# Patient Record
Sex: Male | Born: 1965 | Race: Black or African American | Hispanic: No | Marital: Married | Smoking: Current some day smoker
Health system: Southern US, Community
[De-identification: ages and names within clinical notes are randomized; demographics above are authoritative.]

## PROBLEM LIST (undated history)

## (undated) DIAGNOSIS — T7840XA Allergy, unspecified, initial encounter: Secondary | ICD-10-CM

## (undated) DIAGNOSIS — E785 Hyperlipidemia, unspecified: Secondary | ICD-10-CM

## (undated) HISTORY — PX: NO PAST SURGERIES: SHX2092

## (undated) HISTORY — DX: Allergy, unspecified, initial encounter: T78.40XA

## (undated) HISTORY — DX: Hyperlipidemia, unspecified: E78.5

---

## 2012-08-05 ENCOUNTER — Other Ambulatory Visit (HOSPITAL_COMMUNITY): Payer: Self-pay | Admitting: Otolaryngology

## 2012-08-05 DIAGNOSIS — R1310 Dysphagia, unspecified: Secondary | ICD-10-CM

## 2012-08-12 ENCOUNTER — Ambulatory Visit (HOSPITAL_COMMUNITY)
Admission: RE | Admit: 2012-08-12 | Discharge: 2012-08-12 | Disposition: A | Discharge status: Home / Self Care | Payer: BC Managed Care – PPO | Source: Ambulatory Visit | Attending: Otolaryngology | Admitting: Otolaryngology

## 2012-08-12 ENCOUNTER — Other Ambulatory Visit (HOSPITAL_COMMUNITY): Payer: Self-pay

## 2012-08-12 DIAGNOSIS — R1310 Dysphagia, unspecified: Secondary | ICD-10-CM

## 2012-08-12 DIAGNOSIS — R1312 Dysphagia, oropharyngeal phase: Secondary | ICD-10-CM | POA: Insufficient documentation
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
## 2014-04-16 HISTORY — PX: COLONOSCOPY: SHX174

## 2016-06-08 DIAGNOSIS — Z0000 Encounter for general adult medical examination without abnormal findings: Secondary | ICD-10-CM | POA: Diagnosis not present

## 2016-06-08 DIAGNOSIS — Z125 Encounter for screening for malignant neoplasm of prostate: Secondary | ICD-10-CM | POA: Diagnosis not present

## 2016-06-08 DIAGNOSIS — Z114 Encounter for screening for human immunodeficiency virus [HIV]: Secondary | ICD-10-CM | POA: Diagnosis not present

## 2016-06-08 DIAGNOSIS — Z23 Encounter for immunization: Secondary | ICD-10-CM | POA: Diagnosis not present

## 2016-07-06 DIAGNOSIS — E782 Mixed hyperlipidemia: Secondary | ICD-10-CM | POA: Diagnosis not present

## 2017-02-27 DIAGNOSIS — E782 Mixed hyperlipidemia: Secondary | ICD-10-CM | POA: Diagnosis not present

## 2017-02-27 DIAGNOSIS — K760 Fatty (change of) liver, not elsewhere classified: Secondary | ICD-10-CM | POA: Diagnosis not present

## 2017-05-15 DIAGNOSIS — K5900 Constipation, unspecified: Secondary | ICD-10-CM | POA: Diagnosis not present

## 2017-08-10 ENCOUNTER — Emergency Department (HOSPITAL_BASED_OUTPATIENT_CLINIC_OR_DEPARTMENT_OTHER): Payer: BLUE CROSS/BLUE SHIELD

## 2017-08-10 ENCOUNTER — Emergency Department (HOSPITAL_BASED_OUTPATIENT_CLINIC_OR_DEPARTMENT_OTHER)
Admission: EM | Admit: 2017-08-10 | Discharge: 2017-08-10 | Disposition: A | Discharge status: Home / Self Care | Payer: BLUE CROSS/BLUE SHIELD | Attending: Emergency Medicine | Admitting: Emergency Medicine

## 2017-08-10 ENCOUNTER — Encounter (HOSPITAL_BASED_OUTPATIENT_CLINIC_OR_DEPARTMENT_OTHER): Payer: Self-pay | Admitting: Emergency Medicine

## 2017-08-10 DIAGNOSIS — Y9289 Other specified places as the place of occurrence of the external cause: Secondary | ICD-10-CM | POA: Diagnosis not present

## 2017-08-10 DIAGNOSIS — M542 Cervicalgia: Secondary | ICD-10-CM | POA: Diagnosis not present

## 2017-08-10 DIAGNOSIS — S199XXA Unspecified injury of neck, initial encounter: Secondary | ICD-10-CM | POA: Diagnosis not present

## 2017-08-10 DIAGNOSIS — Y999 Unspecified external cause status: Secondary | ICD-10-CM | POA: Diagnosis not present

## 2017-08-10 DIAGNOSIS — S161XXA Strain of muscle, fascia and tendon at neck level, initial encounter: Secondary | ICD-10-CM | POA: Diagnosis not present

## 2017-08-10 DIAGNOSIS — Y9389 Activity, other specified: Secondary | ICD-10-CM | POA: Diagnosis not present

## 2017-08-10 DIAGNOSIS — F17210 Nicotine dependence, cigarettes, uncomplicated: Secondary | ICD-10-CM | POA: Diagnosis not present

## 2017-08-10 DIAGNOSIS — S0990XA Unspecified injury of head, initial encounter: Secondary | ICD-10-CM | POA: Diagnosis not present

## 2017-08-10 DIAGNOSIS — R51 Headache: Secondary | ICD-10-CM | POA: Diagnosis not present

## 2017-08-10 MED ORDER — HYDROCODONE-ACETAMINOPHEN 5-325 MG PO TABS: 1.0000 | ORAL_TABLET | ORAL | 0 refills | Status: DC | PRN

## 2017-08-10 MED ORDER — CYCLOBENZAPRINE HCL 10 MG PO TABS: 10.0000 mg | ORAL_TABLET | Freq: Two times a day (BID) | ORAL | 0 refills | Status: DC | PRN

## 2017-08-10 MED ORDER — IBUPROFEN 600 MG PO TABS: 600.0000 mg | ORAL_TABLET | Freq: Four times a day (QID) | ORAL | 0 refills | Status: DC | PRN
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
## 2017-08-12 DIAGNOSIS — S161XXS Strain of muscle, fascia and tendon at neck level, sequela: Secondary | ICD-10-CM | POA: Diagnosis not present

## 2017-09-24 DIAGNOSIS — H2513 Age-related nuclear cataract, bilateral: Secondary | ICD-10-CM | POA: Diagnosis not present

## 2017-09-24 DIAGNOSIS — H35031 Hypertensive retinopathy, right eye: Secondary | ICD-10-CM | POA: Diagnosis not present

## 2017-09-24 DIAGNOSIS — H33321 Round hole, right eye: Secondary | ICD-10-CM | POA: Diagnosis not present

## 2017-11-07 DIAGNOSIS — S199XXA Unspecified injury of neck, initial encounter: Secondary | ICD-10-CM | POA: Diagnosis not present

## 2017-11-07 DIAGNOSIS — M542 Cervicalgia: Secondary | ICD-10-CM | POA: Diagnosis not present

## 2017-11-07 DIAGNOSIS — S161XXA Strain of muscle, fascia and tendon at neck level, initial encounter: Secondary | ICD-10-CM | POA: Diagnosis not present

## 2017-11-07 DIAGNOSIS — Y92410 Unspecified street and highway as the place of occurrence of the external cause: Secondary | ICD-10-CM | POA: Diagnosis not present

## 2017-11-07 DIAGNOSIS — Y999 Unspecified external cause status: Secondary | ICD-10-CM | POA: Diagnosis not present

## 2017-12-12 ENCOUNTER — Ambulatory Visit: Payer: BLUE CROSS/BLUE SHIELD | Admitting: Family Medicine

## 2017-12-12 ENCOUNTER — Encounter: Payer: Self-pay | Admitting: Family Medicine

## 2017-12-12 VITALS — BP 132/82 | HR 74 | Temp 98.2°F | Ht 67.5 in | Wt 214.5 lb

## 2017-12-12 DIAGNOSIS — E785 Hyperlipidemia, unspecified: Secondary | ICD-10-CM | POA: Diagnosis not present

## 2017-12-12 DIAGNOSIS — E782 Mixed hyperlipidemia: Secondary | ICD-10-CM | POA: Insufficient documentation

## 2017-12-12 DIAGNOSIS — E669 Obesity, unspecified: Secondary | ICD-10-CM | POA: Insufficient documentation

## 2017-12-12 DIAGNOSIS — Z23 Encounter for immunization: Secondary | ICD-10-CM

## 2017-12-12 DIAGNOSIS — Z720 Tobacco use: Secondary | ICD-10-CM | POA: Insufficient documentation

## 2017-12-12 NOTE — Addendum Note (Signed)
Addended by: Scharlene GlossEWING, Rashawn Rayman B on: 12/12/2017 10:10 AM   Modules accepted: Orders
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
## 2017-12-12 NOTE — Progress Notes (Signed)
Pre visit review using our clinic review tool, if applicable. No additional management support is needed unless otherwise documented below in the visit note. 

## 2017-12-12 NOTE — Patient Instructions (Signed)
Aim to do some physical exertion for 150 minutes per week. This is typically divided into 5 days per week, 30 minutes per day. The activity should be enough to get your heart rate up. Anything is better than nothing if you have time constraints.  Healthy Eating Plan Many factors influence your heart health, including eating and exercise habits. Heart (coronary) risk increases with abnormal blood fat (lipid) levels. Heart-healthy meal planning includes limiting unhealthy fats, increasing healthy fats, and making other small dietary changes. This includes maintaining a healthy body weight to help keep lipid levels within a normal range.  WHAT IS MY PLAN?  Your health care provider recommends that you:  Drink a glass of water before meals to help with satiety.  Eat slowly.  An alternative to the water is to add Metamucil. This will help with satiety as well. It does contain calories, unlike water.  WHAT TYPES OF FAT SHOULD I CHOOSE?  Choose healthy fats more often. Choose monounsaturated and polyunsaturated fats, such as olive oil and canola oil, flaxseeds, walnuts, almonds, and seeds.  Eat more omega-3 fats. Good choices include salmon, mackerel, sardines, tuna, flaxseed oil, and ground flaxseeds. Aim to eat fish at least two times each week.  Avoid foods with partially hydrogenated oils in them. These contain trans fats. Examples of foods that contain trans fats are stick margarine, some tub margarines, cookies, crackers, and other baked goods. If you are going to avoid a fat, this is the one to avoid!  WHAT GENERAL GUIDELINES DO I NEED TO FOLLOW?  Check food labels carefully to identify foods with trans fats. Avoid these types of options when possible.  Fill one half of your plate with vegetables and green salads. Eat 4-5 servings of vegetables per day. A serving of vegetables equals 1 cup of raw leafy vegetables,  cup of raw or cooked cut-up vegetables, or  cup of vegetable  juice.  Fill one fourth of your plate with whole grains. Look for the word "whole" as the first word in the ingredient list.  Fill one fourth of your plate with lean protein foods.  Eat 4-5 servings of fruit per day. A serving of fruit equals one medium whole fruit,  cup of dried fruit,  cup of fresh, frozen, or canned fruit. Try to avoid fruits in cups/syrups as the sugar content can be high.  Eat more foods that contain soluble fiber. Examples of foods that contain this type of fiber are apples, broccoli, carrots, beans, peas, and barley. Aim to get 20-30 g of fiber per day.  Eat more home-cooked food and less restaurant, buffet, and fast food.  Limit or avoid alcohol.  Limit foods that are high in starch and sugar.  Avoid fried foods when able.  Cook foods by using methods other than frying. Baking, boiling, grilling, and broiling are all great options. Other fat-reducing suggestions include: ? Removing the skin from poultry. ? Removing all visible fats from meats. ? Skimming the fat off of stews, soups, and gravies before serving them. ? Steaming vegetables in water or broth.  Lose weight if you are overweight. Losing just 5-10% of your initial body weight can help your overall health and prevent diseases such as diabetes and heart disease.  Increase your consumption of nuts, legumes, and seeds to 4-5 servings per week. One serving of dried beans or legumes equals  cup after being cooked, one serving of nuts equals 1 ounces, and one serving of seeds equals  ounce or   1 tablespoon.  WHAT ARE GOOD FOODS CAN I EAT? Grains Grainy breads (try to find bread that is 3 g of fiber per slice or greater), oatmeal, light popcorn. Whole-grain cereals. Rice and pasta, including brown rice and those that are made with whole wheat. Edamame pasta is a great alternative to grain pasta. It has a higher protein content. Try to avoid significant consumption of white bread, sugary cereals, or  pastries/baked goods.  Vegetables All vegetables. Cooked white potatoes do not count as vegetables.  Fruits All fruits, but limit pineapple and bananas as these fruits have a higher sugar content.  Meats and Other Protein Sources Lean, well-trimmed beef, veal, pork, and lamb. Chicken and turkey without skin. All fish and shellfish. Wild duck, rabbit, pheasant, and venison. Egg whites or low-cholesterol egg substitutes. Dried beans, peas, lentils, and tofu.Seeds and most nuts.  Dairy Low-fat or nonfat cheeses, including ricotta, string, and mozzarella. Skim or 1% milk that is liquid, powdered, or evaporated. Buttermilk that is made with low-fat milk. Nonfat or low-fat yogurt. Soy/Almond milk are good alternatives if you cannot handle dairy.  Beverages Water is the best for you. Sports drinks with less sugar are more desirable unless you are a highly active athlete.  Sweets and Desserts Sherbets and fruit ices. Honey, jam, marmalade, jelly, and syrups. Dark chocolate.  Eat all sweets and desserts in moderation.  Fats and Oils Nonhydrogenated (trans-free) margarines. Vegetable oils, including soybean, sesame, sunflower, olive, peanut, safflower, corn, canola, and cottonseed. Salad dressings or mayonnaise that are made with a vegetable oil. Limit added fats and oils that you use for cooking, baking, salads, and as spreads.  Other Cocoa powder. Coffee and tea. Most condiments.  The items listed above may not be a complete list of recommended foods or beverages. Contact your dietitian for more options.   
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
## 2018-05-05 ENCOUNTER — Encounter: Payer: Self-pay | Admitting: Family Medicine

## 2018-05-05 ENCOUNTER — Telehealth: Payer: Self-pay | Admitting: Gastroenterology

## 2018-05-05 ENCOUNTER — Ambulatory Visit (INDEPENDENT_AMBULATORY_CARE_PROVIDER_SITE_OTHER): Payer: BLUE CROSS/BLUE SHIELD | Admitting: Family Medicine

## 2018-05-05 VITALS — BP 108/78 | HR 58 | Temp 98.1°F | Ht 68.0 in | Wt 197.0 lb

## 2018-05-05 DIAGNOSIS — Z1211 Encounter for screening for malignant neoplasm of colon: Secondary | ICD-10-CM | POA: Diagnosis not present

## 2018-05-05 DIAGNOSIS — Z0000 Encounter for general adult medical examination without abnormal findings: Secondary | ICD-10-CM | POA: Insufficient documentation

## 2018-05-05 DIAGNOSIS — Z125 Encounter for screening for malignant neoplasm of prostate: Secondary | ICD-10-CM

## 2018-05-05 LAB — LIPID PANEL
Cholesterol: 171 mg/dL (ref 0–200)
HDL: 44 mg/dL (ref >39.00)
LDL Cholesterol: 91 mg/dL (ref 0–99)
NonHDL: 127.27
Total CHOL/HDL Ratio: 4
Triglycerides: 179 mg/dL — ABNORMAL HIGH (ref 0.0–149.0)
VLDL: 35.8 mg/dL (ref 0.0–40.0)

## 2018-05-05 LAB — COMPREHENSIVE METABOLIC PANEL
ALT: 25 U/L (ref 0–53)
AST: 18 U/L (ref 0–37)
Albumin: 4.3 g/dL (ref 3.5–5.2)
Alkaline Phosphatase: 50 U/L (ref 39–117)
BUN: 11 mg/dL (ref 6–23)
CO2: 29 mEq/L (ref 19–32)
Calcium: 9.5 mg/dL (ref 8.4–10.5)
Chloride: 107 mEq/L (ref 96–112)
Creatinine, Ser: 0.92 mg/dL (ref 0.40–1.50)
GFR: 112.01 mL/min (ref >60.00)
Glucose, Bld: 87 mg/dL (ref 70–99)
Potassium: 4.2 mEq/L (ref 3.5–5.1)
Sodium: 142 mEq/L (ref 135–145)
Total Bilirubin: 0.5 mg/dL (ref 0.2–1.2)
Total Protein: 6.6 g/dL (ref 6.0–8.3)

## 2018-05-05 LAB — PSA: PSA: 0.73 ng/mL (ref 0.10–4.00)

## 2018-05-05 NOTE — Progress Notes (Signed)
Chief Complaint  Patient presents with  . Annual Exam    Well Male Reginald Webb is here for a complete physical.   His last physical was >1 year ago.  Current diet: in general, a "healthy" diet.   Current exercise: elliptical, walk, lifting weights Weight trend: intentionally losing Does pt snore? Intermittently Daytime fatigue? No. Seat belt? Yes.    Health maintenance Tetanus- Yes  HIV- Yes - 05/05/18  Past Medical History:  Diagnosis Date  . Hyperlipidemia      Past Surgical History:  Procedure Laterality Date  . NO PAST SURGERIES      Medications  Takes no meds routinely.   Allergies No Known Allergies  Family History Family History  Problem Relation Age of Onset  . Cancer Neg Hx     Review of Systems: Constitutional: no fevers or chills Eye:  no recent significant change in vision Ear/Nose/Mouth/Throat:  Ears:  no tinnitus or hearing loss Nose/Mouth/Throat:  no complaints of nasal congestion, no sore throat Cardiovascular:  no chest pain, no palpitations Respiratory:  no cough and no shortness of breath Gastrointestinal:  no abdominal pain, no change in bowel habits GU:  Male: negative for dysuria, frequency, and incontinence and negative for prostate symptoms Musculoskeletal/Extremities:  no pain, redness, or swelling of the joints Integumentary (Skin/Breast):  no abnormal skin lesions reported Neurologic:  no headaches, no numbness, tingling Endocrine: No unexpected weight changes Hematologic/Lymphatic:  no night sweats  Exam BP 108/78 (BP Location: Left Arm, Patient Position: Sitting, Cuff Size: Large)   Pulse (!) 58   Temp 98.1 F (36.7 C) (Oral)   Ht  (1.727 m)   Wt 197 lb (89.4 kg)   SpO2 97%   BMI 29.95 kg/m  General:  well developed, well nourished, in no apparent distress Skin:  no significant moles, warts, or growths Head:  no masses, lesions, or tenderness Eyes:  pupils equal and round, sclera anicteric without  injection Ears:  canals without lesions, TMs shiny without retraction, no obvious effusion, no erythema Nose:  nares patent, septum midline, mucosa normal Throat/Pharynx:  lips and gingiva without lesion; tongue and uvula midline; non-inflamed pharynx; no exudates or postnasal drainage Neck: neck supple without adenopathy, thyromegaly, or masses Lungs:  clear to auscultation, breath sounds equal bilaterally, no respiratory distress Cardio:  regular rate and rhythm, no bruits, no LE edema Abdomen:  abdomen soft, nontender; bowel sounds normal; no masses or organomegaly Genital (male): circumcised penis, no lesions or discharge; testes present bilaterally without masses or tenderness Rectal: Deferred Musculoskeletal:  symmetrical muscle groups noted without atrophy or deformity Extremities:  no clubbing, cyanosis, or edema, no deformities, no skin discoloration Neuro:  gait normal; deep tendon reflexes normal and symmetric Psych: well oriented with normal range of affect and appropriate judgment/insight  Assessment and Plan  Well adult exam - Plan: Lipid panel, Comprehensive metabolic panel  Screen for colon cancer - Plan: Ambulatory referral to Gastroenterology  Screening for malignant neoplasm of prostate - Plan: PSA   Well 52 y.o. male. Counseled on diet and exercise. Counseled on risks and benefits of prostate cancer screening. He agrees to undergo testing. Other orders as above. Follow up in 1 year pending the above workup. The patient voiced understanding and agreement to the plan.  Jilda Roche Kelly, DO 05/05/18 10:38 AM

## 2018-05-05 NOTE — Progress Notes (Signed)
Pre visit review using our clinic review tool, if applicable. No additional management support is needed unless otherwise documented below in the visit note. 

## 2018-05-05 NOTE — Patient Instructions (Signed)
Keep up the good work.  Give [place] 2-3 business days to get the results of your labs back. If labs are normal, you will likely receive a letter in the mail unless you have MyChart. This can take longer than 2-3 business days.   If you do not hear anything about your referral in the next 1-2 weeks, call our office and ask for an update.  Let us know if you need anything.

## 2018-05-06 ENCOUNTER — Encounter: Payer: BLUE CROSS/BLUE SHIELD | Admitting: Family Medicine

## 2018-05-09 ENCOUNTER — Encounter: Payer: BLUE CROSS/BLUE SHIELD | Admitting: Family Medicine

## 2018-05-14 NOTE — Telephone Encounter (Signed)
Dr. Chales Abrahams reviewed records and has accepted patient. Ok to schedule Direct Colon. Patient states that he will callback to schedule.

## 2018-05-26 DIAGNOSIS — Z1211 Encounter for screening for malignant neoplasm of colon: Secondary | ICD-10-CM | POA: Diagnosis not present

## 2018-05-29 ENCOUNTER — Ambulatory Visit: Payer: BLUE CROSS/BLUE SHIELD | Admitting: Family Medicine

## 2018-05-29 ENCOUNTER — Encounter: Payer: Self-pay | Admitting: Family Medicine

## 2018-05-29 VITALS — BP 130/78 | HR 54 | Temp 97.0°F | Ht 68.0 in | Wt 206.4 lb

## 2018-05-29 DIAGNOSIS — B9789 Other viral agents as the cause of diseases classified elsewhere: Secondary | ICD-10-CM | POA: Diagnosis not present

## 2018-05-29 DIAGNOSIS — J069 Acute upper respiratory infection, unspecified: Secondary | ICD-10-CM | POA: Diagnosis not present

## 2018-05-29 MED ORDER — METHYLPREDNISOLONE ACETATE 80 MG/ML IJ SUSP
80.0000 mg | Freq: Once | INTRAMUSCULAR | Status: AC
  Administered 2018-05-29: 80 mg via INTRAMUSCULAR

## 2018-05-29 MED ORDER — BENZONATATE 100 MG PO CAPS: 100.0000 mg | ORAL_CAPSULE | Freq: Three times a day (TID) | ORAL | 0 refills | Status: DC | PRN

## 2018-05-29 MED FILL — BENZONATATE 100 MG CAPSULE: 100 | 10 days supply | Qty: 30 | Fill #0

## 2018-05-29 NOTE — Patient Instructions (Addendum)
Continue to push fluids, practice good hand hygiene, and cover your mouth if you cough.  If you start having fevers, shaking or shortness of breath, seek immediate care.  Claritin (loratadine), Allegra (fexofenadine), Zyrtec (cetirizine); these are listed in order from weakest to strongest. Generic, and therefore cheaper, options are in the parentheses.   There are available OTC, and the generic versions, which may be cheaper, are in parentheses. Show this to a pharmacist if you have trouble finding any of these items.

## 2018-05-29 NOTE — Progress Notes (Signed)
Chief Complaint  Patient presents with  . Cough    congestion    Reginald Webb here for URI complaints.  Duration: 3 days  Associated symptoms: sinus congestion, rhinorrhea, itchy watery eyes, sore throat, chest tightness, sneezing, and cough Denies: sinus pain, ear pain, ear drainage, wheezing, shortness of breath and fevers Treatment to date: cough drops, Alka Seltzer+, Mucinex Sick contacts: No  ROS:  Const: Denies fevers HEENT: As noted in HPI Lungs: No SOB  Past Medical History:  Diagnosis Date  . Hyperlipidemia    Family History  Problem Relation Age of Onset  . Cancer Neg Hx     BP 130/78 (BP Location: Left Arm, Patient Position: Sitting, Cuff Size: Large)   Pulse (!) 54   Temp (!) 97 F (36.1 C) (Oral)   Ht 5\' 8"  (1.727 m)   Wt 206 lb 6 oz (93.6 kg)   SpO2 98%   BMI 31.38 kg/m  General: Awake, alert, appears stated age HEENT: AT, [place], ears patent b/l and TM's neg, nares patent w/o discharge, pharynx pink and without exudates, MMM Neck: No masses or asymmetry Heart: RRR Lungs: CTAB, no accessory muscle use Psych: Age appropriate judgment and insight, normal mood and affect  Viral URI with cough - Plan: benzonatate (TESSALON) 100 MG capsule  Orders as above. PO antihistamine also rec'd.  Continue to push fluids, practice good hand hygiene, cover mouth when coughing. F/u prn. If starting to experience fevers, shaking, or shortness of breath, seek immediate care. Pt voiced understanding and agreement to the plan.  Jilda Rocheicholas Paul [place], DO 05/29/18 9:45 AM

## 2018-05-29 NOTE — Addendum Note (Signed)
Addended by: Scharlene GlossEWING, Ceili Boshers B on: 05/29/2018 09:50 AM   Modules accepted: Orders

## 2018-05-29 NOTE — Progress Notes (Signed)
Pre visit review using our clinic review tool, if applicable. No additional management support is needed unless otherwise documented below in the visit note. 

## 2018-07-10 NOTE — Telephone Encounter (Signed)
Patient has not returned phone call to schedule. Records will be in "records reviewed" folder. °

## 2018-10-28 DIAGNOSIS — H40013 Open angle with borderline findings, low risk, bilateral: Secondary | ICD-10-CM | POA: Diagnosis not present

## 2018-10-31 DIAGNOSIS — H2513 Age-related nuclear cataract, bilateral: Secondary | ICD-10-CM | POA: Diagnosis not present

## 2018-10-31 DIAGNOSIS — H33321 Round hole, right eye: Secondary | ICD-10-CM | POA: Diagnosis not present

## 2018-10-31 DIAGNOSIS — H35031 Hypertensive retinopathy, right eye: Secondary | ICD-10-CM | POA: Diagnosis not present

## 2018-12-02 DIAGNOSIS — H33321 Round hole, right eye: Secondary | ICD-10-CM | POA: Diagnosis not present

## 2019-02-10 ENCOUNTER — Ambulatory Visit: Payer: BLUE CROSS/BLUE SHIELD | Admitting: Internal Medicine

## 2019-02-10 ENCOUNTER — Encounter: Payer: Self-pay | Admitting: Internal Medicine

## 2019-02-10 VITALS — BP 126/80 | HR 61 | Temp 97.7°F | Resp 16 | Ht 68.0 in | Wt 204.1 lb

## 2019-02-10 DIAGNOSIS — M545 Low back pain, unspecified: Secondary | ICD-10-CM

## 2019-02-10 NOTE — Progress Notes (Signed)
Subjective:    Patient ID: Reginald Webb, male    DOB: [birth year], 53 y.o.   MRN: [id number]  DOS:  02/10/2019 Type of visit - description: Acute visit Yesterday he went to the gym as he usually does and did a lot of squatting and weightlifting. Last night, he was uncomfortable with right-sided low back pain.  No radiation Today, the pain got a little worse, he got aspirin containing OTC medication and it helped. Has not been able to go to work. He has occasional back pain but not as intense as this.   Review of Systems Denies fever chills No bladder or bowel incontinence No rash at the back No dysuria or gross hematuria  Past Medical History:  Diagnosis Date  . Hyperlipidemia     Past Surgical History:  Procedure Laterality Date  . NO PAST SURGERIES      Social History   Socioeconomic History  . Marital status: Married    Spouse name: Not on file  . Number of children: Not on file  . Years of education: Not on file  . Highest education level: Not on file  Occupational History  . Not on file  Social Needs  . Financial resource strain: Not on file  . Food insecurity:    Worry: Not on file    Inability: Not on file  . Transportation needs:    Medical: Not on file    Non-medical: Not on file  Tobacco Use  . Smoking status: Current Some Day Smoker    Types: Cigars  . Smokeless tobacco: Never Used  . Tobacco comment: Weekends- Black/Milds average of 3-4  Substance and Sexual Activity  . Alcohol use: Yes    Comment: rarely  . Drug use: No  . Sexual activity: Yes    Partners: Female  Lifestyle  . Physical activity:    Days per week: Not on file    Minutes per session: Not on file  . Stress: Not on file  Relationships  . Social connections:    Talks on phone: Not on file    Gets together: Not on file    Attends religious service: Not on file    Active member of club or organization: Not on file    Attends meetings of clubs or organizations: Not on file      Relationship status: Not on file  . Intimate partner violence:    Fear of current or ex partner: Not on file    Emotionally abused: Not on file    Physically abused: Not on file    Forced sexual activity: Not on file  Other Topics Concern  . Not on file  Social History Narrative  . Not on file      Allergies as of 02/10/2019   No Known Allergies     Medication List       Accurate as of February 10, 2019  2:43 PM. Always use your most recent med list.        benzonatate 100 MG capsule Commonly known as:  TESSALON Take 1 capsule (100 mg total) by mouth 3 (three) times daily as needed.           Objective:   Physical Exam BP 126/80 (BP Location: Left Arm, Patient Position: Sitting, Cuff Size: Normal)   Pulse 61   Temp 97.7 F (36.5 C) (Oral)   Resp 16   Ht 5\' 8"  (1.727 m)   Wt 204 lb 2 oz (92.6 kg)  SpO2 97%   BMI 31.04 kg/m  General:   Well developed, NAD, BMI noted.  HEENT:  Normocephalic . Face symmetric, atraumatic Abdomen:  Not distended, soft, non-tender. No rebound or rigidity. MSK: Slightly TTP at the right lower back around the SI joint.  No TTP at the lumbosacral spine. Skin: Not pale. Not jaundice Neurologic:  alert & oriented X3.  Speech normal, gait appropriate for age and unassisted. Motor and DTR symmetric.  Straight leg test negative. Very mild antalgic posture when he laid down on the table Psych--  Cognition and judgment appear intact.  Cooperative with normal attention span and concentration.  Behavior appropriate. No anxious or depressed appearing.     Assessment     53 year old male, PMH includes hyperlipidemia, presents with Acute lumbalgia: No red flag symptoms, probably a back sprain and/or  SI joint injury or irritation. Recommend conservative treatment with rest, cold, heat, continue with a OTC aspirin-containing medication.  GI precautions discussed. Start gentle stretching once he is better. Call if not gradually  improving.  See AVS. Work note provided.

## 2019-02-10 NOTE — Patient Instructions (Signed)
Rest, no heavy lifting for few days  Ice pack to the back  for the next 3 nights  Heating pad afterwards  Continue using the aspirin containing OTC medication, take it with food to prevent gastritis or stomach irritation.  Call if not gradually better in the next 7 to 10 days.

## 2019-02-10 NOTE — Progress Notes (Signed)
Pre visit review using our clinic review tool, if applicable. No additional management support is needed unless otherwise documented below in the visit note. 

## 2019-10-19 ENCOUNTER — Other Ambulatory Visit: Payer: Self-pay

## 2019-10-20 ENCOUNTER — Encounter: Payer: Self-pay | Admitting: Family Medicine

## 2019-10-20 ENCOUNTER — Ambulatory Visit (INDEPENDENT_AMBULATORY_CARE_PROVIDER_SITE_OTHER): Payer: BLUE CROSS/BLUE SHIELD | Admitting: Family Medicine

## 2019-10-20 VITALS — BP 108/62 | HR 64 | Temp 96.0°F | Ht 68.0 in | Wt 206.0 lb

## 2019-10-20 DIAGNOSIS — J302 Other seasonal allergic rhinitis: Secondary | ICD-10-CM

## 2019-10-20 DIAGNOSIS — Z1211 Encounter for screening for malignant neoplasm of colon: Secondary | ICD-10-CM | POA: Diagnosis not present

## 2019-10-20 DIAGNOSIS — Z125 Encounter for screening for malignant neoplasm of prostate: Secondary | ICD-10-CM

## 2019-10-20 DIAGNOSIS — Z0000 Encounter for general adult medical examination without abnormal findings: Secondary | ICD-10-CM | POA: Diagnosis not present

## 2019-10-20 LAB — CBC
HCT: 46 % (ref 39.0–52.0)
Hemoglobin: 15 g/dL (ref 13.0–17.0)
MCHC: 32.7 g/dL (ref 30.0–36.0)
MCV: 92.9 fl (ref 78.0–100.0)
Platelets: 236 10*3/uL (ref 150.0–400.0)
RBC: 4.95 Mil/uL (ref 4.22–5.81)
RDW: 13.9 % (ref 11.5–15.5)
WBC: 5.5 10*3/uL (ref 4.0–10.5)

## 2019-10-20 LAB — COMPREHENSIVE METABOLIC PANEL
ALT: 33 U/L (ref 0–53)
AST: 21 U/L (ref 0–37)
Albumin: 4.7 g/dL (ref 3.5–5.2)
Alkaline Phosphatase: 45 U/L (ref 39–117)
BUN: 15 mg/dL (ref 6–23)
CO2: 31 mEq/L (ref 19–32)
Calcium: 9.8 mg/dL (ref 8.4–10.5)
Chloride: 102 mEq/L (ref 96–112)
Creatinine, Ser: 0.93 mg/dL (ref 0.40–1.50)
GFR: 103.47 mL/min (ref >60.00)
Glucose, Bld: 98 mg/dL (ref 70–99)
Potassium: 4.7 mEq/L (ref 3.5–5.1)
Sodium: 139 mEq/L (ref 135–145)
Total Bilirubin: 0.5 mg/dL (ref 0.2–1.2)
Total Protein: 6.9 g/dL (ref 6.0–8.3)

## 2019-10-20 LAB — LIPID PANEL
Cholesterol: 200 mg/dL (ref 0–200)
HDL: 40.4 mg/dL (ref >39.00)
LDL Cholesterol: 130 mg/dL — ABNORMAL HIGH (ref 0–99)
NonHDL: 159.13
Total CHOL/HDL Ratio: 5
Triglycerides: 148 mg/dL (ref 0.0–149.0)
VLDL: 29.6 mg/dL (ref 0.0–40.0)

## 2019-10-20 MED ORDER — CETIRIZINE HCL 10 MG PO TABS: 10.0000 mg | ORAL_TABLET | Freq: Every day | ORAL | 5 refills | Status: DC

## 2019-10-20 NOTE — Progress Notes (Signed)
Chief Complaint  Patient presents with  . Annual Exam     allergies/a little cough after being outside    Well Male Reginald Webb is here for a complete physical.   His last physical was >1 year ago.  Current diet: in general, a "pretty good" diet.  [facility]; cardio and wt lifting Weight trend: stable Daytime fatigue? No. Seat belt? Yes.    Health mainten4ance Shingrix- No Colonoscopy- Yes Tetanus- Yes HIV- Yes   Past Medical History:  Diagnosis Date  . Hyperlipidemia       Past Surgical History:  Procedure Laterality Date  . NO PAST SURGERIES      Medications  Takes no meds routinely.   Allergies No Known Allergies  Family History Family History  Problem Relation Age of Onset  . Cancer Neg Hx     Review of Systems: Constitutional:  no fevers Eye:  no recent significant change in vision Ear/Nose/Mouth/Throat:  Ears:  no hearing loss Nose/Mouth/Throat:  no complaints of nasal congestion, no sore throat Cardiovascular:  no chest pain, no palpitations Respiratory:  no cough and no shortness of breath Gastrointestinal:  no abdominal pain, no change in bowel habits GU:  Male: negative for dysuria, frequency, and incontinence and negative for prostate symptoms Musculoskeletal/Extremities:  no pain, redness, or swelling of the joints Integumentary (Skin/Breast):  no abnormal skin lesions reported Neurologic:  no headaches Endocrine: No unexpected weight changes Hematologic/Lymphatic:  no abnormal bleeding  Exam BP 108/62 (BP Location: Left Arm, Patient Position: Sitting, Cuff Size: Large)   Pulse 64   Temp (!) 96 F (35.6 C) (Temporal)   Ht 5\' 8"  (1.727 m)   Wt 206 lb (93.4 kg)   SpO2 95%   BMI 31.32 kg/m  General:  well developed, well nourished, in no apparent distress Skin:  no significant moles, warts, or growths Head:  no masses, lesions, or tenderness Eyes:  pupils equal and round, sclera anicteric without  injection Ears:  canals without lesions, TMs shiny without retraction, no obvious effusion, no erythema Nose:  nares patent, septum midline, mucosa normal Throat/Pharynx:  lips and gingiva without lesion; tongue and uvula midline; non-inflamed pharynx; no exudates or postnasal drainage Neck: neck supple without adenopathy, thyromegaly, or masses Lungs:  clear to auscultation, breath sounds equal bilaterally, no respiratory distress Cardio:  regular rate and rhythm, no LE edema, no bruits Abdomen:  abdomen soft, nontender; bowel sounds normal; no masses or organomegaly Rectal: Deferred Musculoskeletal:  symmetrical muscle groups noted without atrophy or deformity Extremities:  no clubbing, cyanosis, or edema, no deformities, no skin discoloration Neuro:  gait normal; deep tendon reflexes normal and symmetric Psych: well oriented with normal range of affect and appropriate judgment/insight  Assessment and Plan  Well adult exam - Plan: Lipid panel, CBC, Comprehensive metabolic panel  Screen for colon cancer - Plan: Ambulatory referral to Gastroenterology  Screening for malignant neoplasm of prostate - Plan: PSA  Seasonal allergies   Well 53 y.o. male. Counseled on diet and exercise. Counseled on risks and benefits of prostate cancer screening with PSA. The patient agrees to undergo testing. Immunizations, labs, and further orders as above. Follow up in 1 yr or prn. The patient voiced understanding and agreement to the plan.  [place], DO 10/20/19 10:51 AM

## 2019-10-20 NOTE — Patient Instructions (Addendum)
Give [place] 2-3 business days to get the results of your labs back.   Keep the diet clean and stay active.  If you do not hear anything about your referral in the next 1-2 weeks, call our office and ask for an update.  The new Shingrix vaccine (for shingles) is a 2 shot series. It can make people feel low energy, achy and almost like they have the flu for 48 hours after injection. Please plan accordingly when deciding on when to get this shot. Call our office for a nurse visit appointment to get this. The second shot of the series is less severe regarding the side effects, but it still lasts 48 hours.   Claritin (loratadine), Allegra (fexofenadine), Zyrtec (cetirizine) which is also equivalent to Xyzal (levocetirizine); these are listed in order from weakest to strongest. Generic, and therefore cheaper, options are in the parentheses.   There are available OTC, and the generic versions, which may be cheaper, are in parentheses. Show this to a pharmacist if you have trouble finding any of these items.  Let us know if you need anything.

## 2019-10-22 LAB — PSA: PSA: 0.52 ng/mL (ref 0.10–4.00)

## 2019-10-23 ENCOUNTER — Other Ambulatory Visit: Payer: Self-pay | Admitting: Family Medicine

## 2019-10-23 DIAGNOSIS — E785 Hyperlipidemia, unspecified: Secondary | ICD-10-CM

## 2019-10-28 ENCOUNTER — Encounter: Payer: Self-pay | Admitting: Gastroenterology

## 2019-11-24 ENCOUNTER — Telehealth: Payer: Self-pay

## 2019-11-24 DIAGNOSIS — Z03818 Encounter for observation for suspected exposure to other biological agents ruled out: Secondary | ICD-10-CM | POA: Diagnosis not present

## 2019-11-24 DIAGNOSIS — U071 COVID-19: Secondary | ICD-10-CM | POA: Diagnosis not present

## 2019-11-24 NOTE — Telephone Encounter (Signed)
Patient No Showed for Pre-Visit. Patient was called to reschedule his appointment. Patient states that he was exposed to Covid 19 and was going today for a Covid screening. Patient states that he is experiencing a runny nose and a sore throat. Patient states that he wanted to to cancel his procedure until the first of the year. Patient states that he will call back and reschedule.   Riki Sheer, LPN    (PV)

## 2019-12-08 ENCOUNTER — Encounter: Payer: BLUE CROSS/BLUE SHIELD | Admitting: Gastroenterology

## 2019-12-30 ENCOUNTER — Encounter: Payer: Self-pay | Admitting: Gastroenterology

## 2020-02-05 ENCOUNTER — Ambulatory Visit (AMBULATORY_SURGERY_CENTER): Payer: BC Managed Care – PPO | Admitting: *Deleted

## 2020-02-05 ENCOUNTER — Other Ambulatory Visit: Payer: Self-pay

## 2020-02-05 VITALS — Ht 68.0 in | Wt 198.0 lb

## 2020-02-05 DIAGNOSIS — Z1211 Encounter for screening for malignant neoplasm of colon: Secondary | ICD-10-CM

## 2020-02-05 DIAGNOSIS — Z01818 Encounter for other preprocedural examination: Secondary | ICD-10-CM

## 2020-02-05 NOTE — Progress Notes (Signed)
Pt's previsit is done over the phone and all paperwork (prep instructions, blank consent form to just read over, pre-procedure acknowledgement form and stamped envelope) sent to patient  Pt is aware that care partner will wait in the car during procedure; if they feel like they will be too hot or cold to wait in the car; they may wait in the 4 th floor lobby. Patient is aware to bring only one care partner. We want them to wear a mask (we do not have any that we can provide them), practice social distancing, and we will check their temperatures when they get here.  I did remind the patient that their care partner needs to stay in the parking lot the entire time and have a cell phone available, we will call them when the pt is ready for discharge. Patient will wear mask into building.  No egg or soy allergy  No home oxygen use or problems with anesthesia  No medications for weight loss taken  emmi information given  No trouble moving neck  Pt had inadequate prep last time- 2 day Miralax/Miralax prep given  covid test 02-16-20 at 10:35 am

## 2020-02-16 ENCOUNTER — Other Ambulatory Visit: Payer: Self-pay | Admitting: Gastroenterology

## 2020-02-16 ENCOUNTER — Ambulatory Visit (INDEPENDENT_AMBULATORY_CARE_PROVIDER_SITE_OTHER): Payer: BC Managed Care – PPO

## 2020-02-16 DIAGNOSIS — Z1159 Encounter for screening for other viral diseases: Secondary | ICD-10-CM

## 2020-02-16 LAB — SARS CORONAVIRUS 2 (TAT 6-24 HRS): SARS Coronavirus 2: NEGATIVE

## 2020-02-17 ENCOUNTER — Encounter: Payer: Self-pay | Admitting: Gastroenterology

## 2020-02-19 ENCOUNTER — Other Ambulatory Visit: Payer: Self-pay

## 2020-02-19 ENCOUNTER — Ambulatory Visit (AMBULATORY_SURGERY_CENTER): Payer: BC Managed Care – PPO | Admitting: Gastroenterology

## 2020-02-19 ENCOUNTER — Encounter: Payer: Self-pay | Admitting: Gastroenterology

## 2020-02-19 VITALS — BP 127/78 | HR 57 | Temp 97.7°F | Resp 18 | Ht 68.0 in | Wt 198.0 lb

## 2020-02-19 DIAGNOSIS — Z1211 Encounter for screening for malignant neoplasm of colon: Secondary | ICD-10-CM | POA: Diagnosis not present

## 2020-02-19 DIAGNOSIS — Z86010 Personal history of colonic polyps: Secondary | ICD-10-CM

## 2020-02-19 MED ORDER — SODIUM CHLORIDE 0.9 % IV SOLN: 500.0000 mL | Freq: Once | INTRAVENOUS | Status: DC

## 2020-02-19 NOTE — Progress Notes (Signed)
To PACU, vss. Report to [email or web address] 

## 2020-02-19 NOTE — Patient Instructions (Signed)
Handouts on diverticulosis and hemorrhoids given to you today  Come to GI clinic as needed     YOU HAD AN ENDOSCOPIC PROCEDURE TODAY AT THE [facility]:   Refer to the procedure report that was given to you for any specific questions about what was found during the examination.  If the procedure report does not answer your questions, please call your gastroenterologist to clarify.  If you requested that your care partner not be given the details of your procedure findings, then the procedure report has been included in a sealed envelope for you to review at your convenience later.  YOU SHOULD EXPECT: Some feelings of bloating in the abdomen. Passage of more gas than usual.  Walking can help get rid of the air that was put into your GI tract during the procedure and reduce the bloating. If you had a lower endoscopy (such as a colonoscopy or flexible sigmoidoscopy) you may notice spotting of blood in your stool or on the toilet paper. If you underwent a bowel prep for your procedure, you may not have a normal bowel movement for a few days.  Please Note:  You might notice some irritation and congestion in your nose or some drainage.  This is from the oxygen used during your procedure.  There is no need for concern and it should clear up in a day or so.  SYMPTOMS TO REPORT IMMEDIATELY:   Following lower endoscopy (colonoscopy or flexible sigmoidoscopy):  Excessive amounts of blood in the stool  Significant tenderness or worsening of abdominal pains  Swelling of the abdomen that is new, acute  Fever of 100F or higher  For urgent or emergent issues, a gastroenterologist can be reached at any hour by calling [phone].   DIET:  We do recommend a small meal at first, but then you may proceed to your regular diet.  Drink plenty of fluids but you should avoid alcoholic beverages for 24 hours.  ACTIVITY:  You should plan to take it easy for the rest of today and you should NOT DRIVE  or use heavy machinery until tomorrow (because of the sedation medicines used during the test).    FOLLOW UP: Our staff will call the number listed on your records 48-72 hours following your procedure to check on you and address any questions or concerns that you may have regarding the information given to you following your procedure. If we do not reach you, we will leave a message.  We will attempt to reach you two times.  During this call, we will ask if you have developed any symptoms of COVID 19. If you develop any symptoms (ie: fever, flu-like symptoms, shortness of breath, cough etc.) before then, please call [phone].  If you test positive for Covid 19 in the 2 weeks post procedure, please call and report this information to [place].    If any biopsies were taken you will be contacted by phone or by letter within the next 1-3 weeks.  Please call us at [phone] if you have not heard about the biopsies in 3 weeks.    SIGNATURES/CONFIDENTIALITY: You and/or your care partner have signed paperwork which will be entered into your electronic medical record.  These signatures attest to the fact that that the information above on your After Visit Summary has been reviewed and is understood.  Full responsibility of the confidentiality of this discharge information lies with you and/or your care-partner.

## 2020-02-19 NOTE — Progress Notes (Signed)
Temp-JB VS-CW  Pt's states no medical or surgical changes since previsit or office visit.  

## 2020-02-19 NOTE — Op Note (Signed)
[place] Patient Name: Reginald Webb Procedure Date: 02/19/2020 9:01 AM MRN: [id number] Endoscopist: Jackquline Denmark , MD Age: 54 Referring MD:  Date of Birth: 12-09-1966 Gender: Male Account #: [id number] Procedure:                Colonoscopy Indications:              Screening for colorectal malignant neoplasm. Prev                            colon 03/2014- poor prep, hyperplastic polyps (Dr                            Virgel Bouquet) Medicines:                Monitored Anesthesia Care Procedure:                Pre-Anesthesia Assessment:                           - Prior to the procedure, a History and Physical                            was performed, and patient medications and                            allergies were reviewed. The patient's tolerance of                            previous anesthesia was also reviewed. The risks                            and benefits of the procedure and the sedation                            options and risks were discussed with the patient.                            All questions were answered, and informed consent                            was obtained. Prior Anticoagulants: The patient has                            taken no previous anticoagulant or antiplatelet                            agents. ASA Grade Assessment: I - A normal, healthy                            patient. After reviewing the risks and benefits,                            the patient was deemed in satisfactory condition to  undergo the procedure.                           After obtaining informed consent, the colonoscope                            was passed under direct vision. Throughout the                            procedure, the patient's blood pressure, pulse, and                            oxygen saturations were monitored continuously. The                            Colonoscope was introduced through the anus and                advanced to the Cecum. The colonoscopy was                            performed without difficulty. The patient tolerated                            the procedure well. The quality of the bowel                            preparation was good. The ileocecal valve,                            appendiceal orifice, and rectum were photographed. Scope In: 9:05:51 AM Scope Out: 9:15:28 AM Scope Withdrawal Time: 0 hours 6 minutes 54 seconds  Total Procedure Duration: 0 hours 9 minutes 37 seconds  Findings:                 A few small-mouthed diverticula were found in the                            sigmoid colon.                           Non-bleeding internal hemorrhoids were found during                            retroflexion. The hemorrhoids were small.                           The exam was otherwise without abnormality on                            direct and retroflexion views. Complications:            No immediate complications. Estimated Blood Loss:     Estimated blood loss: none. Impression:               -Mild sigmoid diverticulosis.                           -  Otherwise normal colonoscopy. Recommendation:           - Patient has a contact number available for                            emergencies. The signs and symptoms of potential                            delayed complications were discussed with the                            patient. Return to normal activities tomorrow.                            Written discharge instructions were provided to the                            patient.                           - Resume previous diet.                           - Continue present medications.                           - Repeat colonoscopy in 10 years for screening                            purposes. Earlier, if with any new problems or if                            there is any change in family history.                           - Return to GI clinic PRN. Lynann Bologna, MD 02/19/2020 9:21:05 AM This report has been signed electronically.

## 2020-02-23 ENCOUNTER — Telehealth: Payer: Self-pay

## 2020-02-23 NOTE — Telephone Encounter (Signed)
  Follow up Call-  Call back number 02/19/2020  Post procedure Call Back phone  # [phone]  Permission to leave phone message Yes  Some recent data might be hidden     Patient questions:  Do you have a fever, pain , or abdominal swelling? No. Pain Score  0 *  Have you tolerated food without any problems? Yes.    Have you been able to return to your normal activities? Yes.    Do you have any questions about your discharge instructions: Diet   No. Medications  No. Follow up visit  No.  Do you have questions or concerns about your Care? No.  Actions: * If pain score is 4 or above: No action needed, pain <4. 1. Have you developed a fever since your procedure? no  2.   Have you had an respiratory symptoms (SOB or cough) since your procedure? no  3.   Have you tested positive for COVID 19 since your procedure no  4.   Have you had any family members/close contacts diagnosed with the COVID 19 since your procedure?  no   If yes to any of these questions please route to Laverna Peace, RN and Jennye Boroughs, Charity fundraiser.

## 2020-02-23 NOTE — Telephone Encounter (Signed)
  Follow up Call-  Call back number 02/19/2020  Post procedure Call Back phone  # [phone]  Permission to leave phone message Yes  Some recent data might be hidden     Left message

## 2020-04-20 ENCOUNTER — Other Ambulatory Visit (INDEPENDENT_AMBULATORY_CARE_PROVIDER_SITE_OTHER): Payer: BC Managed Care – PPO

## 2020-04-20 ENCOUNTER — Other Ambulatory Visit: Payer: Self-pay

## 2020-04-20 DIAGNOSIS — E785 Hyperlipidemia, unspecified: Secondary | ICD-10-CM

## 2020-04-20 LAB — LIPID PANEL
Cholesterol: 191 mg/dL (ref 0–200)
HDL: 41.6 mg/dL (ref >39.00)
LDL Cholesterol: 122 mg/dL — ABNORMAL HIGH (ref 0–99)
NonHDL: 148.91
Total CHOL/HDL Ratio: 5
Triglycerides: 133 mg/dL (ref 0.0–149.0)
VLDL: 26.6 mg/dL (ref 0.0–40.0)

## 2020-09-07 ENCOUNTER — Other Ambulatory Visit: Payer: Self-pay

## 2020-09-07 ENCOUNTER — Other Ambulatory Visit: Payer: BC Managed Care – PPO

## 2020-09-07 DIAGNOSIS — Z20822 Contact with and (suspected) exposure to covid-19: Secondary | ICD-10-CM

## 2020-09-09 LAB — NOVEL CORONAVIRUS, NAA: SARS-CoV-2, NAA: NOT DETECTED

## 2020-09-09 LAB — SARS-COV-2, NAA 2 DAY TAT

## 2020-10-24 ENCOUNTER — Encounter: Payer: BLUE CROSS/BLUE SHIELD | Admitting: Family Medicine

## 2020-11-01 ENCOUNTER — Ambulatory Visit (INDEPENDENT_AMBULATORY_CARE_PROVIDER_SITE_OTHER): Payer: BC Managed Care – PPO | Admitting: Family Medicine

## 2020-11-01 ENCOUNTER — Encounter: Payer: Self-pay | Admitting: Family Medicine

## 2020-11-01 ENCOUNTER — Other Ambulatory Visit: Payer: Self-pay

## 2020-11-01 ENCOUNTER — Other Ambulatory Visit: Payer: Self-pay | Admitting: Family Medicine

## 2020-11-01 VITALS — BP 108/64 | HR 57 | Temp 98.0°F | Ht 68.0 in | Wt 209.2 lb

## 2020-11-01 DIAGNOSIS — Z0000 Encounter for general adult medical examination without abnormal findings: Secondary | ICD-10-CM

## 2020-11-01 DIAGNOSIS — Z1159 Encounter for screening for other viral diseases: Secondary | ICD-10-CM | POA: Diagnosis not present

## 2020-11-01 DIAGNOSIS — Z125 Encounter for screening for malignant neoplasm of prostate: Secondary | ICD-10-CM

## 2020-11-01 DIAGNOSIS — E785 Hyperlipidemia, unspecified: Secondary | ICD-10-CM

## 2020-11-01 LAB — COMPREHENSIVE METABOLIC PANEL
ALT: 40 U/L (ref 0–53)
AST: 24 U/L (ref 0–37)
Albumin: 4.5 g/dL (ref 3.5–5.2)
Alkaline Phosphatase: 43 U/L (ref 39–117)
BUN: 15 mg/dL (ref 6–23)
CO2: 31 mEq/L (ref 19–32)
Calcium: 9.7 mg/dL (ref 8.4–10.5)
Chloride: 101 mEq/L (ref 96–112)
Creatinine, Ser: 1.01 mg/dL (ref 0.40–1.50)
GFR: 85.53 mL/min (ref >60.00)
Glucose, Bld: 100 mg/dL — ABNORMAL HIGH (ref 70–99)
Potassium: 4.4 mEq/L (ref 3.5–5.1)
Sodium: 138 mEq/L (ref 135–145)
Total Bilirubin: 0.6 mg/dL (ref 0.2–1.2)
Total Protein: 6.7 g/dL (ref 6.0–8.3)

## 2020-11-01 LAB — LIPID PANEL
Cholesterol: 214 mg/dL — ABNORMAL HIGH (ref 0–200)
HDL: 46 mg/dL (ref >39.00)
NonHDL: 168.12
Total CHOL/HDL Ratio: 5
Triglycerides: 242 mg/dL — ABNORMAL HIGH (ref 0.0–149.0)
VLDL: 48.4 mg/dL — ABNORMAL HIGH (ref 0.0–40.0)

## 2020-11-01 LAB — LDL CHOLESTEROL, DIRECT: Direct LDL: 145 mg/dL

## 2020-11-01 LAB — PSA: PSA: 0.44 ng/mL (ref 0.10–4.00)

## 2020-11-01 NOTE — Progress Notes (Signed)
Chief Complaint  Patient presents with  . Annual Exam    Well Male Reginald Webb is here for a complete physical.   His last physical was >1 year ago.  Current diet: in general, a "healthy" diet.  Current exercise: lifting, cardio Weight trend: stable Fatigue out of ordinary? No. Seat belt? Yes.    Health maintenance Shingrix- No Colonoscopy- Yes Tetanus- Yes HIV- Yes Hep C- No   Past Medical History:  Diagnosis Date  . Allergy   . Hyperlipidemia    no per pt      Past Surgical History:  Procedure Laterality Date  . COLONOSCOPY  04/16/2014  . NO PAST SURGERIES      Medications  Current Outpatient Medications on File Prior to Visit  Medication Sig Dispense Refill  . cetirizine (ZYRTEC ALLERGY) 10 MG tablet Take 1 tablet (10 mg total) by mouth daily. (Patient taking differently: Take 10 mg by mouth daily. Takes PRN) 30 tablet 5  . Multiple Vitamin (MULTIVITAMIN ADULT PO) Take by mouth daily.      Allergies No Known Allergies  Family History Family History  Problem Relation Age of Onset  . Cancer Neg Hx   . Colon cancer Neg Hx   . Esophageal cancer Neg Hx   . Rectal cancer Neg Hx   . Stomach cancer Neg Hx   . Colon polyps Neg Hx     Review of Systems: Constitutional:  no fevers Eye:  no recent significant change in vision Ear/Nose/Mouth/Throat:  Ears:  no hearing loss Nose/Mouth/Throat:  no complaints of nasal congestion, no sore throat Cardiovascular:  no chest pain Respiratory:  no shortness of breath Gastrointestinal:  no change in bowel habits GU:  Male: negative for dysuria, frequency Musculoskeletal/Extremities:  no joint pain Integumentary (Skin/Breast):  no abnormal skin lesions reported Neurologic:  no headaches Endocrine: No unexpected weight changes Hematologic/Lymphatic:  no abnormal bleeding  Exam BP 108/64 (BP Location: Left Arm, Patient Position: Sitting, Cuff Size: Normal)   Pulse (!) 57   Temp 98 F (36.7 C) (Oral)   Ht 5\' 8"   (1.727 m)   Wt 209 lb 4 oz (94.9 kg)   SpO2 98%   BMI 31.82 kg/m  General:  well developed, well nourished, in no apparent distress Skin:  no significant moles, warts, or growths Head:  no masses, lesions, or tenderness Eyes:  pupils equal and round, sclera anicteric without injection Ears:  canals without lesions, TMs shiny without retraction, no obvious effusion, no erythema Nose:  nares patent, septum midline, mucosa normal Throat/Pharynx:  lips and gingiva without lesion; tongue and uvula midline; non-inflamed pharynx; no exudates or postnasal drainage Neck: neck supple without adenopathy, thyromegaly, or masses Cardiac: RRR, no bruits, no LE edema Lungs:  clear to auscultation, breath sounds equal bilaterally, no respiratory distress Rectal: Deferred Musculoskeletal:  symmetrical muscle groups noted without atrophy or deformity Neuro:  gait normal; deep tendon reflexes normal and symmetric Psych: well oriented with normal range of affect and appropriate judgment/insight  Assessment and Plan  Well adult exam - Plan: Lipid panel, Comprehensive metabolic panel  Screening for malignant neoplasm of prostate - Plan: PSA  Encounter for hepatitis C screening test for low risk patient - Plan: Hepatitis C antibody   Well 54 y.o. male. Counseled on diet and exercise. Counseled on risks and benefits of prostate cancer screening with PSA. The patient agrees to undergo testing. Immunizations, labs, and further orders as above. Rec'd Shingrix, covid and flu vaccination.  Follow up in  a year for CPE or prn. The patient voiced understanding and agreement to the plan.  Jilda Roche [place], DO 11/01/20 11:02 AM

## 2020-11-01 NOTE — Patient Instructions (Signed)
Give [place] 2-3 business days to get the results of your labs back.   Keep the diet clean and stay active.  I do recommend you get the COVID-19 vaccination, either Pfizer or Moderna. Please don't hesitate to reach out if you have any questions.   The new Shingrix vaccine (for shingles) is a 2 shot series. It can make people feel low energy, achy and almost like they have the flu for 48 hours after injection. Please plan accordingly when deciding on when to get this shot. Call our office for a nurse visit appointment to get this. The second shot of the series is less severe regarding the side effects, but it still lasts 48 hours.   Let us know if you need anything.

## 2020-11-02 LAB — HEPATITIS C ANTIBODY
Hepatitis C Ab: NONREACTIVE
SIGNAL TO CUT-OFF: 0.01 (ref <1.00)

## 2020-11-25 DIAGNOSIS — R197 Diarrhea, unspecified: Secondary | ICD-10-CM | POA: Diagnosis not present

## 2020-11-25 DIAGNOSIS — Z20822 Contact with and (suspected) exposure to covid-19: Secondary | ICD-10-CM | POA: Diagnosis not present

## 2020-11-25 DIAGNOSIS — R112 Nausea with vomiting, unspecified: Secondary | ICD-10-CM | POA: Diagnosis not present

## 2020-12-13 ENCOUNTER — Other Ambulatory Visit: Payer: Self-pay | Admitting: Family Medicine

## 2020-12-13 ENCOUNTER — Other Ambulatory Visit: Payer: Self-pay

## 2020-12-13 ENCOUNTER — Other Ambulatory Visit (INDEPENDENT_AMBULATORY_CARE_PROVIDER_SITE_OTHER): Payer: BC Managed Care – PPO

## 2020-12-13 DIAGNOSIS — E785 Hyperlipidemia, unspecified: Secondary | ICD-10-CM | POA: Diagnosis not present

## 2020-12-13 LAB — LIPID PANEL
Cholesterol: 202 mg/dL — ABNORMAL HIGH (ref 0–200)
HDL: 46.3 mg/dL (ref >39.00)
NonHDL: 155.7
Total CHOL/HDL Ratio: 4
Triglycerides: 268 mg/dL — ABNORMAL HIGH (ref 0.0–149.0)
VLDL: 53.6 mg/dL — ABNORMAL HIGH (ref 0.0–40.0)

## 2020-12-13 LAB — LDL CHOLESTEROL, DIRECT: Direct LDL: 127 mg/dL

## 2020-12-13 MED ORDER — ROSUVASTATIN CALCIUM 20 MG PO TABS: 20.0000 mg | ORAL_TABLET | Freq: Every day | ORAL | 3 refills | Status: DC

## 2021-04-19 ENCOUNTER — Other Ambulatory Visit: Payer: Self-pay

## 2021-04-19 ENCOUNTER — Ambulatory Visit: Payer: BC Managed Care – PPO | Admitting: Family Medicine

## 2021-04-19 ENCOUNTER — Encounter: Payer: Self-pay | Admitting: Family Medicine

## 2021-04-19 VITALS — BP 120/78 | HR 58 | Temp 97.6°F | Ht 68.0 in | Wt 209.0 lb

## 2021-04-19 DIAGNOSIS — N529 Male erectile dysfunction, unspecified: Secondary | ICD-10-CM | POA: Diagnosis not present

## 2021-04-19 MED ORDER — SILDENAFIL CITRATE 100 MG PO TABS: 50.0000 mg | ORAL_TABLET | Freq: Every day | ORAL | 2 refills | Status: DC | PRN

## 2021-04-19 NOTE — Progress Notes (Signed)
Chief Complaint  Patient presents with  . discuss getting viagra    Subjective: Patient is a 55 y.o. male here for ED.  Patient has a history of difficulty attaining and maintaining an erection for the past several months.  His libido is unchanged.  A friend gave him a tablet of Viagra and that worked well.  He is requesting his own prescription.  He has never been on the medicine  Past Medical History:  Diagnosis Date  . Allergy   . Hyperlipidemia    no per pt    Objective: BP 120/78 (BP Location: Right Arm, Patient Position: Sitting, Cuff Size: Normal)   Pulse (!) 58   Temp 97.6 F (36.4 C) (Oral)   Ht 5\' 8"  (1.727 m)   Wt 209 lb (94.8 kg)   SpO2 98%   BMI 31.78 kg/m  General: Awake, appears stated age Lungs: No accessory muscle use Psych: Age appropriate judgment and insight, normal affect and mood  Assessment and Plan: Erectile dysfunction, unspecified erectile dysfunction type - Plan: sildenafil (VIAGRA) 100 MG tablet  Trial sildenafil 100 mg daily.  I sent this in electronically to his pharmacy.  If it is too expensive, I did give him a hardcopy to take to any pharmacy using goodRx. The patient voiced understanding and agreement to the plan.  [place], DO 04/19/21  10:41 AM

## 2021-04-19 NOTE — Patient Instructions (Addendum)
Use Good[facility] if it is too expensive at [facility]. Let me know if you have questions with that.   Let us know if you need anything.

## 2021-11-14 ENCOUNTER — Other Ambulatory Visit: Payer: Self-pay | Admitting: Family Medicine

## 2021-12-08 DIAGNOSIS — R030 Elevated blood-pressure reading, without diagnosis of hypertension: Secondary | ICD-10-CM | POA: Diagnosis not present

## 2021-12-08 DIAGNOSIS — H6121 Impacted cerumen, right ear: Secondary | ICD-10-CM | POA: Diagnosis not present

## 2021-12-08 DIAGNOSIS — E669 Obesity, unspecified: Secondary | ICD-10-CM | POA: Diagnosis not present

## 2021-12-08 DIAGNOSIS — Z6832 Body mass index (BMI) 32.0-32.9, adult: Secondary | ICD-10-CM | POA: Diagnosis not present

## 2022-03-05 ENCOUNTER — Ambulatory Visit (INDEPENDENT_AMBULATORY_CARE_PROVIDER_SITE_OTHER): Payer: BC Managed Care – PPO | Admitting: Family Medicine

## 2022-03-05 ENCOUNTER — Encounter: Payer: Self-pay | Admitting: Family Medicine

## 2022-03-05 VITALS — BP 108/78 | HR 55 | Temp 98.0°F | Ht 68.0 in | Wt 201.5 lb

## 2022-03-05 DIAGNOSIS — Z125 Encounter for screening for malignant neoplasm of prostate: Secondary | ICD-10-CM | POA: Diagnosis not present

## 2022-03-05 DIAGNOSIS — Z0000 Encounter for general adult medical examination without abnormal findings: Secondary | ICD-10-CM | POA: Diagnosis not present

## 2022-03-05 LAB — CBC
HCT: 45.2 % (ref 39.0–52.0)
Hemoglobin: 14.7 g/dL (ref 13.0–17.0)
MCHC: 32.5 g/dL (ref 30.0–36.0)
MCV: 92.2 fl (ref 78.0–100.0)
Platelets: 234 10*3/uL (ref 150.0–400.0)
RBC: 4.9 Mil/uL (ref 4.22–5.81)
RDW: 14.2 % (ref 11.5–15.5)
WBC: 6.1 10*3/uL (ref 4.0–10.5)

## 2022-03-05 LAB — COMPREHENSIVE METABOLIC PANEL
ALT: 29 U/L (ref 0–53)
AST: 19 U/L (ref 0–37)
Albumin: 4.7 g/dL (ref 3.5–5.2)
Alkaline Phosphatase: 44 U/L (ref 39–117)
BUN: 18 mg/dL (ref 6–23)
CO2: 29 mEq/L (ref 19–32)
Calcium: 10 mg/dL (ref 8.4–10.5)
Chloride: 105 mEq/L (ref 96–112)
Creatinine, Ser: 0.97 mg/dL (ref 0.40–1.50)
GFR: 88.94 mL/min (ref >60.00)
Glucose, Bld: 98 mg/dL (ref 70–99)
Potassium: 4.4 mEq/L (ref 3.5–5.1)
Sodium: 141 mEq/L (ref 135–145)
Total Bilirubin: 0.5 mg/dL (ref 0.2–1.2)
Total Protein: 6.8 g/dL (ref 6.0–8.3)

## 2022-03-05 LAB — LIPID PANEL
Cholesterol: 123 mg/dL (ref 0–200)
HDL: 50.9 mg/dL (ref >39.00)
LDL Cholesterol: 51 mg/dL (ref 0–99)
NonHDL: 71.96
Total CHOL/HDL Ratio: 2
Triglycerides: 103 mg/dL (ref 0.0–149.0)
VLDL: 20.6 mg/dL (ref 0.0–40.0)

## 2022-03-05 LAB — PSA: PSA: 0.4 ng/mL (ref 0.10–4.00)

## 2022-03-05 NOTE — Progress Notes (Signed)
Chief Complaint  ?Patient presents with  ? Annual Exam  ? ? ?Well Male ?Reginald Webb is here for a complete physical.   ?His last physical was >1 year ago.  ?Current diet: in general, a "healthy" diet.  ?Current exercise: walking, elliptical  ?Weight trend: down a few lbs  ?Fatigue out of ordinary? No. ?Seat belt? Yes.   ?Advanced directive? No ? ?Health maintenance ?Shingrix- No ?Colonoscopy- Yes ?Tetanus- Yes ?HIV- Yes ?Hep C- Yes ?  ?Past Medical History:  ?Diagnosis Date  ? Allergy   ? Hyperlipidemia   ? no per pt  ?  ? ? ?Past Surgical History:  ?Procedure Laterality Date  ? COLONOSCOPY  04/16/2014  ? NO PAST SURGERIES    ? ? ?Medications  ?Current Outpatient Medications on File Prior to Visit  ?Medication Sig Dispense Refill  ? cetirizine (ZYRTEC ALLERGY) 10 MG tablet Take 1 tablet (10 mg total) by mouth daily. (Patient taking differently: Take 10 mg by mouth daily. Takes PRN) 30 tablet 5  ? Multiple Vitamin (MULTIVITAMIN ADULT PO) Take by mouth daily.    ? rosuvastatin (CRESTOR) 20 MG tablet TAKE 1 TABLET BY MOUTH EVERY DAY 90 tablet 3  ? sildenafil (VIAGRA) 100 MG tablet Take 0.5-1 tablets (50-100 mg total) by mouth daily as needed for erectile dysfunction. 30 tablet 2  ? ?No current facility-administered medications on file prior to visit.  ?  ? ?Allergies ?No Known Allergies ? ?Family History ?Family History  ?Problem Relation Age of Onset  ? Cancer Neg Hx   ? Colon cancer Neg Hx   ? Esophageal cancer Neg Hx   ? Rectal cancer Neg Hx   ? Stomach cancer Neg Hx   ? Colon polyps Neg Hx   ? ? ?Review of Systems: ?Constitutional:  no fevers ?Eye:  no recent significant change in vision ?Ear/Nose/Mouth/Throat:  Ears:  no hearing loss ?Nose/Mouth/Throat:  no complaints of nasal congestion, no sore throat ?Cardiovascular:  no chest pain ?Respiratory:  no shortness of breath ?Gastrointestinal:  no change in bowel habits ?GU:  Male: negative for dysuria, frequency ?Musculoskeletal/Extremities:  no joint  pain ?Integumentary (Skin/Breast):  no abnormal skin lesions reported ?Neurologic:  no headaches ?Endocrine: No unexpected weight changes ?Hematologic/Lymphatic:  no abnormal bleeding ? ?Exam ?BP 108/78   Pulse (!) 55   Temp 98 ?F (36.7 ?C) (Oral)   Ht 5\' 8"  (1.727 m)   Wt 201 lb 8 oz (91.4 kg)   SpO2 99%   BMI 30.64 kg/m?  ?General:  well developed, well nourished, in no apparent distress ?Skin:  no significant moles, warts, or growths ?Head:  no masses, lesions, or tenderness ?Eyes:  pupils equal and round, sclera anicteric without injection ?Ears:  canals without lesions, TMs shiny without retraction, no obvious effusion, no erythema ?Nose:  nares patent, septum midline, mucosa normal ?Throat/Pharynx:  lips and gingiva without lesion; tongue and uvula midline; non-inflamed pharynx; no exudates or postnasal drainage ?Neck: neck supple without adenopathy, thyromegaly, or masses ?Cardiac: RRR, no bruits, no LE edema ?Lungs:  clear to auscultation, breath sounds equal bilaterally, no respiratory distress ?Abdomen: BS+, soft, non-tender, non-distended, no masses or organomegaly noted ?Rectal: Deferred ?Musculoskeletal:  symmetrical muscle groups noted without atrophy or deformity ?Neuro:  gait normal; deep tendon reflexes normal and symmetric ?Psych: well oriented with normal range of affect and appropriate judgment/insight ? ?Assessment and Plan ? ?Well adult exam - Plan: CBC, Comprehensive metabolic panel, Lipid panel ? ?Screening for malignant neoplasm of prostate - Plan:  PSA  ? ?Well 56 y.o. male. ?Counseled on diet and exercise. ?Counseled on risks and benefits of prostate cancer screening with PSA. The patient agrees to undergo testing. ?Advanced directive form provided today.  ?Shingrix rec'd.  ?Immunizations, labs, and further orders as above. ?Follow up in 6 mo. ?The patient voiced understanding and agreement to the plan. ? ?Shelda Pal, DO ?03/05/22 ?11:01 AM ? ?

## 2022-03-05 NOTE — Patient Instructions (Addendum)
Give us 2-3 business days to get the results of your labs back.  ? ?Keep the diet clean and stay active. ? ?Please get me a copy of your advanced directive form at your convenience.  ? ?The Shingrix vaccine (for shingles) is a 2 shot series. It can make people feel low energy, achy and almost like they have the flu for 48 hours after injection. Please plan accordingly when deciding on when to get this shot. Call our office for a nurse visit appointment to get this. The second shot of the series is less severe regarding the side effects, but it still lasts 48 hours.  ? ?Let us know if you need anything. ?

## 2022-03-06 ENCOUNTER — Encounter: Payer: BC Managed Care – PPO | Admitting: Family Medicine

## 2022-09-03 ENCOUNTER — Ambulatory Visit: Payer: BC Managed Care – PPO | Admitting: Family Medicine

## 2022-09-03 ENCOUNTER — Encounter: Payer: Self-pay | Admitting: Family Medicine

## 2022-09-03 VITALS — BP 132/80 | HR 64 | Temp 98.0°F | Ht 68.0 in | Wt 199.2 lb

## 2022-09-03 DIAGNOSIS — E782 Mixed hyperlipidemia: Secondary | ICD-10-CM | POA: Diagnosis not present

## 2022-09-03 DIAGNOSIS — N529 Male erectile dysfunction, unspecified: Secondary | ICD-10-CM | POA: Diagnosis not present

## 2022-09-03 MED ORDER — SILDENAFIL CITRATE 100 MG PO TABS: 50.0000 mg | ORAL_TABLET | Freq: Every day | ORAL | 2 refills | Status: DC | PRN

## 2022-09-03 NOTE — Progress Notes (Signed)
Chief Complaint  Patient presents with   Follow-up    Subjective: Hyperlipidemia Patient presents for Hyperlipidemia follow up. Currently taking Crestro 20 mg/d and compliance with treatment thus far has been good. He denies myalgias. He is adhering to a healthy diet. Exercise: elliptical machine, lifting wts, walking No Cp or SOB.  The patient is not known to have coexisting coronary artery disease.  ED Hx of ED on sildenafil 100 mg qd prn. Works well. No AE's. Affordable.   Past Medical History:  Diagnosis Date   Allergy    Hyperlipidemia     Objective: BP 132/80   Pulse 64   Temp 98 F (36.7 C) (Oral)   Ht 5\' 8"  (1.727 m)   Wt 199 lb 4 oz (90.4 kg)   SpO2 96%   BMI 30.30 kg/m  General: Awake, appears stated age Heart: RRR, no LE edema, no bruits Lungs: CTAB, no rales, wheezes or rhonchi. No accessory muscle use Psych: Age appropriate judgment and insight, normal affect and mood  Assessment and Plan: Mixed hyperlipidemia  Erectile dysfunction, unspecified erectile dysfunction type - Plan: sildenafil (VIAGRA) 100 MG tablet  Chronic, stable.  Counseled on diet/exercise. Cont Crestor 20 mg/d.  Chronic, stable.  Continue sildenafil 100 mg daily as needed. F/u in 6 mo or prn. The patient voiced understanding and agreement to the plan.  [place], DO 09/03/22  12:02 PM

## 2022-09-03 NOTE — Patient Instructions (Signed)
Keep the diet clean and stay active.  I recommend getting the flu shot in mid October. This suggestion would change if the CDC comes out with a different recommendation.   Let us know if you need anything.  

## 2022-11-16 ENCOUNTER — Other Ambulatory Visit: Payer: Self-pay | Admitting: Family Medicine

## 2023-10-21 ENCOUNTER — Ambulatory Visit (INDEPENDENT_AMBULATORY_CARE_PROVIDER_SITE_OTHER): Payer: BC Managed Care – PPO | Admitting: Family Medicine

## 2023-10-21 ENCOUNTER — Encounter: Payer: Self-pay | Admitting: Family Medicine

## 2023-10-21 VITALS — BP 130/78 | HR 63 | Temp 98.0°F | Resp 16 | Ht 68.0 in | Wt 203.2 lb

## 2023-10-21 DIAGNOSIS — Z125 Encounter for screening for malignant neoplasm of prostate: Secondary | ICD-10-CM | POA: Diagnosis not present

## 2023-10-21 DIAGNOSIS — N529 Male erectile dysfunction, unspecified: Secondary | ICD-10-CM

## 2023-10-21 DIAGNOSIS — Z13220 Encounter for screening for lipoid disorders: Secondary | ICD-10-CM

## 2023-10-21 DIAGNOSIS — Z0000 Encounter for general adult medical examination without abnormal findings: Secondary | ICD-10-CM | POA: Diagnosis not present

## 2023-10-21 LAB — LIPID PANEL
Cholesterol: 212 mg/dL — ABNORMAL HIGH (ref 0–200)
HDL: 53.6 mg/dL (ref >39.00)
LDL Cholesterol: 126 mg/dL — ABNORMAL HIGH (ref 0–99)
NonHDL: 158.75
Total CHOL/HDL Ratio: 4
Triglycerides: 163 mg/dL — ABNORMAL HIGH (ref 0.0–149.0)
VLDL: 32.6 mg/dL (ref 0.0–40.0)

## 2023-10-21 LAB — CBC
HCT: 45.9 % (ref 39.0–52.0)
Hemoglobin: 14.6 g/dL (ref 13.0–17.0)
MCHC: 31.9 g/dL (ref 30.0–36.0)
MCV: 95.9 fL (ref 78.0–100.0)
Platelets: 208 10*3/uL (ref 150.0–400.0)
RBC: 4.78 Mil/uL (ref 4.22–5.81)
RDW: 14.2 % (ref 11.5–15.5)
WBC: 6.9 10*3/uL (ref 4.0–10.5)

## 2023-10-21 LAB — COMPREHENSIVE METABOLIC PANEL
ALT: 38 U/L (ref 0–53)
AST: 27 U/L (ref 0–37)
Albumin: 4.6 g/dL (ref 3.5–5.2)
Alkaline Phosphatase: 47 U/L (ref 39–117)
BUN: 15 mg/dL (ref 6–23)
CO2: 29 meq/L (ref 19–32)
Calcium: 10.2 mg/dL (ref 8.4–10.5)
Chloride: 102 meq/L (ref 96–112)
Creatinine, Ser: 1.12 mg/dL (ref 0.40–1.50)
GFR: 73.99 mL/min (ref >60.00)
Glucose, Bld: 85 mg/dL (ref 70–99)
Potassium: 4.1 meq/L (ref 3.5–5.1)
Sodium: 139 meq/L (ref 135–145)
Total Bilirubin: 0.6 mg/dL (ref 0.2–1.2)
Total Protein: 6.9 g/dL (ref 6.0–8.3)

## 2023-10-21 LAB — PSA: PSA: 0.65 ng/mL (ref 0.10–4.00)

## 2023-10-21 MED ORDER — SILDENAFIL CITRATE 100 MG PO TABS: 50.0000 mg | ORAL_TABLET | Freq: Every day | ORAL | 2 refills | Status: AC | PRN

## 2023-10-21 NOTE — Progress Notes (Signed)
Chief Complaint  Patient presents with   Annual Exam    Annual Exam     Well Male Reginald Webb is here for a complete physical.   His last physical was >1 year ago.  Current diet: in general, a "healthy" diet.  Current exercise: elliptical machine, walking, jogging, lifting wts Weight trend: stable Fatigue out of ordinary? No. Seat belt? Yes.   Advanced directive? No  Health maintenance Shingrix- No Colonoscopy- Yes Tetanus- Yes HIV- Yes Hep C- Yes   Past Medical History:  Diagnosis Date   Allergy    Hyperlipidemia    no per pt      Past Surgical History:  Procedure Laterality Date   COLONOSCOPY  04/16/2014   NO PAST SURGERIES      Medications  Current Outpatient Medications on File Prior to Visit  Medication Sig Dispense Refill   cetirizine (ZYRTEC ALLERGY) 10 MG tablet Take 1 tablet (10 mg total) by mouth daily. (Patient taking differently: Take 10 mg by mouth daily. Takes PRN) 30 tablet 5   Multiple Vitamin (MULTIVITAMIN ADULT PO) Take by mouth daily.     rosuvastatin (CRESTOR) 20 MG tablet TAKE 1 TABLET BY MOUTH EVERY DAY 90 tablet 3   sildenafil (VIAGRA) 100 MG tablet Take 0.5-1 tablets (50-100 mg total) by mouth daily as needed for erectile dysfunction. 30 tablet 2    Allergies No Known Allergies  Family History Family History  Problem Relation Age of Onset   Cancer Neg Hx    Colon cancer Neg Hx    Esophageal cancer Neg Hx    Rectal cancer Neg Hx    Stomach cancer Neg Hx    Colon polyps Neg Hx     Review of Systems: Constitutional:  no fevers Eye:  no recent significant change in vision Ear/Nose/Mouth/Throat:  Ears:  no hearing loss Nose/Mouth/Throat:  no complaints of nasal congestion, no sore throat Cardiovascular:  no chest pain Respiratory:  no shortness of breath Gastrointestinal:  no change in bowel habits GU:  Male: negative for dysuria, frequency Musculoskeletal/Extremities:  no joint pain Integumentary (Skin/Breast):  no abnormal  skin lesions reported Neurologic:  no headaches Endocrine: No unexpected weight changes Hematologic/Lymphatic:  no abnormal bleeding  Exam BP 130/78 (BP Location: Left Arm, Patient Position: Sitting, Cuff Size: Normal)   Pulse 63   Temp 98 F (36.7 C) (Oral)   Resp 16   Ht 5\' 8"  (1.727 m)   Wt 203 lb 3.2 oz (92.2 kg)   SpO2 98%   BMI 30.90 kg/m  General:  well developed, well nourished, in no apparent distress Skin:  no significant moles, warts, or growths Head:  no masses, lesions, or tenderness Eyes:  pupils equal and round, sclera anicteric without injection Ears:  canals without lesions, TMs shiny without retraction, no obvious effusion, no erythema Nose:  nares patent, mucosa normal Throat/Pharynx:  lips and gingiva without lesion; tongue and uvula midline; non-inflamed pharynx; no exudates or postnasal drainage Neck: neck supple without adenopathy, thyromegaly, or masses Cardiac: RRR, no bruits, no LE edema Lungs:  clear to auscultation, breath sounds equal bilaterally, no respiratory distress Abdomen: BS+, soft, non-tender, non-distended, no masses or organomegaly noted Rectal: Deferred Musculoskeletal:  symmetrical muscle groups noted without atrophy or deformity Neuro:  gait normal; deep tendon reflexes normal and symmetric Psych: well oriented with normal range of affect and appropriate judgment/insight  Assessment and Plan  Well adult exam - Plan: CBC, Comprehensive metabolic panel, Lipid panel  Screening for prostate cancer -  Plan: PSA   Well 57 y.o. male. Counseled on diet and exercise. Counseled on risks and benefits of prostate cancer screening with PSA. The patient agrees to undergo testing. Advanced directive form provided today.  Shingrix rec'd. Flu shot politely declined.  Immunizations, labs, and further orders as above. Follow up in 6 mo. The patient voiced understanding and agreement to the plan.  Jilda Roche [place], DO 10/21/23 12:54 PM

## 2023-10-21 NOTE — Patient Instructions (Signed)
Give us 2-3 business days to get the results of your labs back.   Keep the diet clean and stay active.  Please get me a copy of your advanced directive form at your convenience.   The Shingrix vaccine (for shingles) is a 2 shot series spaced 2-6 months apart. It can make people feel low energy, achy and almost like they have the flu for 48 hours after injection. 1/5 people can have nausea and/or vomiting. Please plan accordingly when deciding on when to get this shot. Call our office for a nurse visit appointment to get this. The second shot of the series is less severe regarding the side effects, but it still lasts 48 hours.   Let us know if you need anything.  

## 2024-01-10 ENCOUNTER — Encounter: Payer: Self-pay | Admitting: Physician Assistant

## 2024-01-10 ENCOUNTER — Ambulatory Visit: Payer: BC Managed Care – PPO | Admitting: Physician Assistant

## 2024-01-10 VITALS — BP 130/70 | HR 58 | Temp 97.8°F | Resp 12 | Ht 68.0 in | Wt 206.8 lb

## 2024-01-10 DIAGNOSIS — M79672 Pain in left foot: Secondary | ICD-10-CM | POA: Diagnosis not present

## 2024-01-10 MED ORDER — PREDNISONE 20 MG PO TABS: 20.0000 mg | ORAL_TABLET | Freq: Every day | ORAL | 0 refills | Status: DC

## 2024-01-10 NOTE — Progress Notes (Signed)
      Established patient visit   Patient: Reginald Webb   DOB: 1966-07-14   58 y.o. Male  MRN: [id number] Visit Date: 01/10/2024  Today's healthcare provider: Alfredia Ferguson, PA-C   Chief Complaint  Patient presents with   left heel pain   Subjective     Pt reports new onset, a few days or left heel pain. He is unable to bear weight/walk without paid. Denies injury. He is often on his feet for work.  Medications: Outpatient Medications Prior to Visit  Medication Sig   cetirizine (ZYRTEC ALLERGY) 10 MG tablet Take 1 tablet (10 mg total) by mouth daily. (Patient taking differently: Take 10 mg by mouth daily. Takes PRN)   Multiple Vitamin (MULTIVITAMIN ADULT PO) Take by mouth daily.   rosuvastatin (CRESTOR) 20 MG tablet TAKE 1 TABLET BY MOUTH EVERY DAY   sildenafil (VIAGRA) 100 MG tablet Take 0.5-1 tablets (50-100 mg total) by mouth daily as needed for erectile dysfunction.   No facility-administered medications prior to visit.    Review of Systems  Constitutional:  Negative for fatigue and fever.  Respiratory:  Negative for cough and shortness of breath.   Cardiovascular:  Negative for chest pain, palpitations and leg swelling.  Musculoskeletal:  Positive for arthralgias.  Neurological:  Negative for dizziness and headaches.       Objective    BP 130/70 (BP Location: Left Arm, Cuff Size: Normal)   Pulse (!) 58   Temp 97.8 F (36.6 C) (Oral)   Resp 12   Ht 5\' 8"  (1.727 m)   Wt 206 lb 12.8 oz (93.8 kg)   SpO2 98%   BMI 31.44 kg/m    Physical Exam Vitals reviewed.  Constitutional:      Appearance: He is not ill-appearing.  HENT:     Head: Normocephalic.  Eyes:     Conjunctiva/sclera: Conjunctivae normal.  Cardiovascular:     Rate and Rhythm: Normal rate.  Pulmonary:     Effort: Pulmonary effort is normal. No respiratory distress.  Musculoskeletal:     Comments: No visible injuries, deformities, but foot esp heel is very dry, cracked.   Non tender to  exam, full rom  Neurological:     General: No focal deficit present.     Mental Status: He is alert and oriented to person, place, and time.  Psychiatric:        Mood and Affect: Mood normal.        Behavior: Behavior normal.     No results found for any visits on 01/10/24.  Assessment & Plan    Pain of left heel -     predniSONE; Take 1 tablet (20 mg total) by mouth daily with breakfast.  Dispense: 5 tablet; Refill: 0  Recommending prednisone x 5 days  Given information on heel/plantar fascitis exercises If symptoms continue would ref to podiatry   Return if symptoms worsen or fail to improve.       Alfredia Ferguson, PA-C  [facility] [phone] (phone) [phone] (fax)  [facility]

## 2024-04-20 ENCOUNTER — Ambulatory Visit: Payer: BC Managed Care – PPO | Admitting: Family Medicine

## 2024-04-20 ENCOUNTER — Encounter: Payer: Self-pay | Admitting: Family Medicine

## 2024-04-20 VITALS — BP 126/78 | HR 75 | Temp 98.0°F | Resp 16 | Ht 68.0 in | Wt 206.0 lb

## 2024-04-20 DIAGNOSIS — E782 Mixed hyperlipidemia: Secondary | ICD-10-CM

## 2024-04-20 MED ORDER — ROSUVASTATIN CALCIUM 20 MG PO TABS: 20.0000 mg | ORAL_TABLET | Freq: Every day | ORAL | 3 refills | Status: AC

## 2024-04-20 NOTE — Progress Notes (Signed)
 Chief Complaint  Patient presents with   Follow-up    Follow up    Subjective: Hyperlipidemia Patient presents for mixed hyperlipidemia follow up. Currently taking Crestor  20 mg/d and compliance with treatment thus far has been poor. He denies myalgias. He is usually adhering to a healthy diet. Exercise: walking, elliptical No CP or SOB.  The patient is not known to have coexisting coronary artery disease.  Past Medical History:  Diagnosis Date   Allergy    Hyperlipidemia    no per pt    Objective: BP 126/78 (BP Location: Left Arm, Patient Position: Sitting)   Pulse 75   Temp 98 F (36.7 C) (Oral)   Resp 16   Ht 5\' 8"  (1.727 m)   Wt 206 lb (93.4 kg)   SpO2 98%   BMI 31.32 kg/m  General: Awake, appears stated age HEENT: MMM Heart: RRR, no LE edema, no bruits Lungs: CTAB, no rales, wheezes or rhonchi. No accessory muscle use Psych: Age appropriate judgment and insight, normal affect and mood  Assessment and Plan: Mixed hyperlipidemia - Plan: rosuvastatin  (CRESTOR ) 20 MG tablet  Chronic, hopefully stable. Reorder Crestor  20 mg/d. Counseled on diet/exercise.  F/u in 6 mo. The patient voiced understanding and agreement to the plan.  Shellie Dials [place], DO 04/20/24  1:14 PM

## 2024-04-20 NOTE — Patient Instructions (Signed)
 Give [place] 2-3 business days to get the results of your labs back.   Keep the diet clean and stay active.  Let us know if you need anything.

## 2024-04-20 NOTE — Addendum Note (Signed)
 Addended by: Creed Dodrill on: 04/20/2024 01:23 PM   Modules accepted: Orders

## 2024-04-21 ENCOUNTER — Encounter: Payer: Self-pay | Admitting: Family Medicine

## 2024-04-21 LAB — COMPREHENSIVE METABOLIC PANEL WITH GFR
ALT: 32 U/L (ref 0–53)
AST: 22 U/L (ref 0–37)
Albumin: 4.5 g/dL (ref 3.5–5.2)
Alkaline Phosphatase: 36 U/L — ABNORMAL LOW (ref 39–117)
BUN: 16 mg/dL (ref 6–23)
CO2: 30 meq/L (ref 19–32)
Calcium: 9.7 mg/dL (ref 8.4–10.5)
Chloride: 102 meq/L (ref 96–112)
Creatinine, Ser: 1 mg/dL (ref 0.40–1.50)
GFR: 84.48 mL/min (ref >60.00)
Glucose, Bld: 78 mg/dL (ref 70–99)
Potassium: 4.1 meq/L (ref 3.5–5.1)
Sodium: 140 meq/L (ref 135–145)
Total Bilirubin: 0.5 mg/dL (ref 0.2–1.2)
Total Protein: 6.6 g/dL (ref 6.0–8.3)

## 2024-04-21 LAB — LIPID PANEL
Cholesterol: 190 mg/dL (ref 0–200)
HDL: 42.8 mg/dL (ref >39.00)
LDL Cholesterol: 117 mg/dL — ABNORMAL HIGH (ref 0–99)
NonHDL: 146.93
Total CHOL/HDL Ratio: 4
Triglycerides: 148 mg/dL (ref 0.0–149.0)
VLDL: 29.6 mg/dL (ref 0.0–40.0)

## 2024-07-27 DIAGNOSIS — M531 Cervicobrachial syndrome: Secondary | ICD-10-CM | POA: Diagnosis not present

## 2024-07-27 DIAGNOSIS — M5413 Radiculopathy, cervicothoracic region: Secondary | ICD-10-CM | POA: Diagnosis not present

## 2024-07-27 DIAGNOSIS — M5411 Radiculopathy, occipito-atlanto-axial region: Secondary | ICD-10-CM | POA: Diagnosis not present

## 2024-07-27 DIAGNOSIS — M5412 Radiculopathy, cervical region: Secondary | ICD-10-CM | POA: Diagnosis not present

## 2024-07-30 DIAGNOSIS — M5413 Radiculopathy, cervicothoracic region: Secondary | ICD-10-CM | POA: Diagnosis not present

## 2024-07-30 DIAGNOSIS — M5412 Radiculopathy, cervical region: Secondary | ICD-10-CM | POA: Diagnosis not present

## 2024-07-30 DIAGNOSIS — M5411 Radiculopathy, occipito-atlanto-axial region: Secondary | ICD-10-CM | POA: Diagnosis not present

## 2024-07-30 DIAGNOSIS — M531 Cervicobrachial syndrome: Secondary | ICD-10-CM | POA: Diagnosis not present

## 2024-08-03 DIAGNOSIS — M5413 Radiculopathy, cervicothoracic region: Secondary | ICD-10-CM | POA: Diagnosis not present

## 2024-08-03 DIAGNOSIS — M5412 Radiculopathy, cervical region: Secondary | ICD-10-CM | POA: Diagnosis not present

## 2024-08-03 DIAGNOSIS — M531 Cervicobrachial syndrome: Secondary | ICD-10-CM | POA: Diagnosis not present

## 2024-08-03 DIAGNOSIS — M5411 Radiculopathy, occipito-atlanto-axial region: Secondary | ICD-10-CM | POA: Diagnosis not present

## 2024-08-06 DIAGNOSIS — M531 Cervicobrachial syndrome: Secondary | ICD-10-CM | POA: Diagnosis not present

## 2024-08-06 DIAGNOSIS — M5411 Radiculopathy, occipito-atlanto-axial region: Secondary | ICD-10-CM | POA: Diagnosis not present

## 2024-08-06 DIAGNOSIS — M5412 Radiculopathy, cervical region: Secondary | ICD-10-CM | POA: Diagnosis not present

## 2024-08-06 DIAGNOSIS — M5413 Radiculopathy, cervicothoracic region: Secondary | ICD-10-CM | POA: Diagnosis not present

## 2024-08-10 DIAGNOSIS — M5413 Radiculopathy, cervicothoracic region: Secondary | ICD-10-CM | POA: Diagnosis not present

## 2024-08-10 DIAGNOSIS — M5411 Radiculopathy, occipito-atlanto-axial region: Secondary | ICD-10-CM | POA: Diagnosis not present

## 2024-08-10 DIAGNOSIS — M5412 Radiculopathy, cervical region: Secondary | ICD-10-CM | POA: Diagnosis not present

## 2024-08-10 DIAGNOSIS — M531 Cervicobrachial syndrome: Secondary | ICD-10-CM | POA: Diagnosis not present

## 2024-08-12 DIAGNOSIS — M5413 Radiculopathy, cervicothoracic region: Secondary | ICD-10-CM | POA: Diagnosis not present

## 2024-08-12 DIAGNOSIS — M5411 Radiculopathy, occipito-atlanto-axial region: Secondary | ICD-10-CM | POA: Diagnosis not present

## 2024-08-12 DIAGNOSIS — M5412 Radiculopathy, cervical region: Secondary | ICD-10-CM | POA: Diagnosis not present

## 2024-08-12 DIAGNOSIS — M531 Cervicobrachial syndrome: Secondary | ICD-10-CM | POA: Diagnosis not present

## 2024-08-13 DIAGNOSIS — M5412 Radiculopathy, cervical region: Secondary | ICD-10-CM | POA: Diagnosis not present

## 2024-08-13 DIAGNOSIS — M5411 Radiculopathy, occipito-atlanto-axial region: Secondary | ICD-10-CM | POA: Diagnosis not present

## 2024-08-13 DIAGNOSIS — M5413 Radiculopathy, cervicothoracic region: Secondary | ICD-10-CM | POA: Diagnosis not present

## 2024-08-13 DIAGNOSIS — M531 Cervicobrachial syndrome: Secondary | ICD-10-CM | POA: Diagnosis not present

## 2024-08-31 DIAGNOSIS — M5412 Radiculopathy, cervical region: Secondary | ICD-10-CM | POA: Diagnosis not present

## 2024-08-31 DIAGNOSIS — M531 Cervicobrachial syndrome: Secondary | ICD-10-CM | POA: Diagnosis not present

## 2024-08-31 DIAGNOSIS — M5411 Radiculopathy, occipito-atlanto-axial region: Secondary | ICD-10-CM | POA: Diagnosis not present

## 2024-08-31 DIAGNOSIS — M5413 Radiculopathy, cervicothoracic region: Secondary | ICD-10-CM | POA: Diagnosis not present

## 2024-10-05 DIAGNOSIS — M531 Cervicobrachial syndrome: Secondary | ICD-10-CM | POA: Diagnosis not present

## 2024-10-05 DIAGNOSIS — M5412 Radiculopathy, cervical region: Secondary | ICD-10-CM | POA: Diagnosis not present

## 2024-10-05 DIAGNOSIS — M5411 Radiculopathy, occipito-atlanto-axial region: Secondary | ICD-10-CM | POA: Diagnosis not present

## 2024-10-05 DIAGNOSIS — M5413 Radiculopathy, cervicothoracic region: Secondary | ICD-10-CM | POA: Diagnosis not present

## 2024-10-21 ENCOUNTER — Encounter: Admitting: Family Medicine

## 2024-10-23 ENCOUNTER — Encounter: Admitting: Family Medicine

## 2024-11-25 ENCOUNTER — Other Ambulatory Visit: Payer: Self-pay

## 2024-11-25 ENCOUNTER — Encounter: Payer: Self-pay | Admitting: Family Medicine

## 2024-11-25 ENCOUNTER — Ambulatory Visit: Payer: Self-pay | Admitting: Family Medicine

## 2024-11-25 ENCOUNTER — Ambulatory Visit: Admitting: Family Medicine

## 2024-11-25 VITALS — BP 128/78 | HR 69 | Temp 98.0°F | Resp 16 | Ht 68.0 in | Wt 201.4 lb

## 2024-11-25 DIAGNOSIS — E7800 Pure hypercholesterolemia, unspecified: Secondary | ICD-10-CM

## 2024-11-25 DIAGNOSIS — Z0000 Encounter for general adult medical examination without abnormal findings: Secondary | ICD-10-CM

## 2024-11-25 DIAGNOSIS — Z125 Encounter for screening for malignant neoplasm of prostate: Secondary | ICD-10-CM

## 2024-11-25 LAB — CBC
HCT: 47.6 % (ref 39.0–52.0)
Hemoglobin: 15.5 g/dL (ref 13.0–17.0)
MCHC: 32.5 g/dL (ref 30.0–36.0)
MCV: 94.6 fl (ref 78.0–100.0)
Platelets: 231 K/uL (ref 150.0–400.0)
RBC: 5.03 Mil/uL (ref 4.22–5.81)
RDW: 14.2 % (ref 11.5–15.5)
WBC: 5.5 K/uL (ref 4.0–10.5)

## 2024-11-25 LAB — COMPREHENSIVE METABOLIC PANEL WITH GFR
ALT: 38 U/L (ref 0–53)
AST: 27 U/L (ref 0–37)
Albumin: 5 g/dL (ref 3.5–5.2)
Alkaline Phosphatase: 58 U/L (ref 39–117)
BUN: 14 mg/dL (ref 6–23)
CO2: 28 meq/L (ref 19–32)
Calcium: 10.5 mg/dL (ref 8.4–10.5)
Chloride: 100 meq/L (ref 96–112)
Creatinine, Ser: 1 mg/dL (ref 0.40–1.50)
GFR: 84.12 mL/min (ref >60.00)
Glucose, Bld: 101 mg/dL — ABNORMAL HIGH (ref 70–99)
Potassium: 4.2 meq/L (ref 3.5–5.1)
Sodium: 139 meq/L (ref 135–145)
Total Bilirubin: 0.7 mg/dL (ref 0.2–1.2)
Total Protein: 7.4 g/dL (ref 6.0–8.3)

## 2024-11-25 LAB — LIPID PANEL
Cholesterol: 220 mg/dL — ABNORMAL HIGH (ref 0–200)
HDL: 51.2 mg/dL (ref >39.00)
LDL Cholesterol: 130 mg/dL — ABNORMAL HIGH (ref 0–99)
NonHDL: 168.48
Total CHOL/HDL Ratio: 4
Triglycerides: 192 mg/dL — ABNORMAL HIGH (ref 0.0–149.0)
VLDL: 38.4 mg/dL (ref 0.0–40.0)

## 2024-11-25 LAB — HEPATITIS B SURFACE ANTIBODY, QUANTITATIVE: Hep B S AB Quant (Post): <5 m[IU]/mL — ABNORMAL LOW (ref >=10)

## 2024-11-25 LAB — PSA: PSA: 0.8 ng/mL (ref 0.10–4.00)

## 2024-11-25 NOTE — Patient Instructions (Signed)

## 2024-11-25 NOTE — Progress Notes (Signed)
 Chief Complaint  Patient presents with   Annual Exam    CPE    Well Male Reginald Webb is here for a complete physical.   His last physical was >1 year ago.  Current diet: in general, a healthy diet.  Current exercise: elliptical machine, strength training Weight trend: stable Fatigue out of ordinary? No. Seat belt? Yes.   Advanced directive? No  Health maintenance Shingrix- No Colonoscopy- Yes Tetanus- Yes HIV- Yes Hep C- Yes   Past Medical History:  Diagnosis Date   Allergy    Hyperlipidemia    no per pt      Past Surgical History:  Procedure Laterality Date   COLONOSCOPY  04/16/2014    Medications  Current Outpatient Medications on File Prior to Visit  Medication Sig Dispense Refill   Multiple Vitamin (MULTIVITAMIN ADULT PO) Take by mouth daily.     rosuvastatin  (CRESTOR ) 20 MG tablet Take 1 tablet (20 mg total) by mouth daily. 90 tablet 3   sildenafil  (VIAGRA ) 100 MG tablet Take 0.5-1 tablets (50-100 mg total) by mouth daily as needed for erectile dysfunction. 30 tablet 2   No current facility-administered medications on file prior to visit.     Allergies No Known Allergies  Family History Family History  Problem Relation Age of Onset   Cancer Neg Hx    Colon cancer Neg Hx    Esophageal cancer Neg Hx    Rectal cancer Neg Hx    Stomach cancer Neg Hx    Colon polyps Neg Hx     Review of Systems: Constitutional:  no fevers Eye:  no recent significant change in vision Ear/Nose/Mouth/Throat:  Ears:  no hearing loss Nose/Mouth/Throat:  no complaints of nasal congestion, no sore throat Cardiovascular:  no chest pain Respiratory:  no shortness of breath Gastrointestinal:  no change in bowel habits GU:  Male: negative for dysuria, frequency Musculoskeletal/Extremities:  no joint pain Integumentary (Skin/Breast):  no abnormal skin lesions reported Neurologic:  no headaches Endocrine: No unexpected weight changes Hematologic/Lymphatic:  no abnormal  bleeding  Exam BP 128/78 (BP Location: Left Arm, Patient Position: Sitting)   Pulse 69   Temp 98 F (36.7 C) (Oral)   Resp 16   Ht 5' 8 (1.727 m)   Wt 201 lb 6.4 oz (91.4 kg)   SpO2 99%   BMI 30.62 kg/m  General:  well developed, well nourished, in no apparent distress Skin:  no significant moles, warts, or growths Head:  no masses, lesions, or tenderness Eyes:  pupils equal and round, sclera anicteric without injection Ears:  canals without lesions, TMs shiny without retraction, no obvious effusion, no erythema Nose:  nares patent, mucosa normal Throat/Pharynx:  lips and gingiva without lesion; tongue and uvula midline; non-inflamed pharynx; no exudates or postnasal drainage Neck: neck supple without adenopathy, thyromegaly, or masses Cardiac: RRR, no bruits, no LE edema Lungs:  clear to auscultation, breath sounds equal bilaterally, no respiratory distress Abdomen: BS+, soft, non-tender, non-distended, no masses or organomegaly noted Rectal: Deferred Musculoskeletal:  symmetrical muscle groups noted without atrophy or deformity Neuro:  gait normal; deep tendon reflexes normal and symmetric Psych: well oriented with normal range of affect and appropriate judgment/insight  Assessment and Plan  Well adult exam - Plan: CBC, Comprehensive metabolic panel with GFR, Lipid panel, Hepatitis B surface antibody,quantitative  Screening for prostate cancer - Plan: PSA   Well 58 y.o. male. Counseled on diet and exercise. Counseled on risks and benefits of prostate cancer screening with PSA.  The patient agrees to undergo testing. PCV20 politely declined along with Shingrix.  Flu shot politely declined.  Immunizations, labs, and further orders as above. Follow up in 6 mo. The patient voiced understanding and agreement to the plan.  Mabel [place], DO 11/25/24 8:14 AM

## 2024-11-26 ENCOUNTER — Ambulatory Visit: Payer: Self-pay | Admitting: Family Medicine

## 2024-11-26 ENCOUNTER — Other Ambulatory Visit (INDEPENDENT_AMBULATORY_CARE_PROVIDER_SITE_OTHER)

## 2024-11-26 ENCOUNTER — Ambulatory Visit

## 2024-11-26 DIAGNOSIS — E669 Obesity, unspecified: Secondary | ICD-10-CM

## 2024-11-26 LAB — HEMOGLOBIN A1C: Hgb A1c MFr Bld: 4.8 % (ref 4.6–6.5)

## 2025-01-05 ENCOUNTER — Ambulatory Visit: Payer: Self-pay | Admitting: Family Medicine

## 2025-01-05 ENCOUNTER — Other Ambulatory Visit (INDEPENDENT_AMBULATORY_CARE_PROVIDER_SITE_OTHER): Payer: Self-pay

## 2025-01-05 DIAGNOSIS — E7800 Pure hypercholesterolemia, unspecified: Secondary | ICD-10-CM

## 2025-01-05 LAB — LIPID PANEL
Cholesterol: 193 mg/dL (ref 28–200)
HDL: 46.3 mg/dL
LDL Cholesterol: 117 mg/dL — ABNORMAL HIGH (ref 10–99)
NonHDL: 146.36
Total CHOL/HDL Ratio: 4
Triglycerides: 149 mg/dL (ref 10.0–149.0)
VLDL: 29.8 mg/dL (ref 0.0–40.0)

## 2025-05-26 ENCOUNTER — Ambulatory Visit: Admitting: Family Medicine
